# Patient Record
Sex: Male | Born: 1935 | Race: White | Hispanic: No | Marital: Married | Smoking: Never smoker
Health system: Southern US, Community
[De-identification: ages and names within clinical notes are randomized; demographics above are authoritative.]

## PROBLEM LIST (undated history)

## (undated) DIAGNOSIS — I10 Essential (primary) hypertension: Secondary | ICD-10-CM

## (undated) DIAGNOSIS — U071 COVID-19: Secondary | ICD-10-CM

## (undated) DIAGNOSIS — F0390 Unspecified dementia without behavioral disturbance: Secondary | ICD-10-CM

## (undated) DIAGNOSIS — I219 Acute myocardial infarction, unspecified: Secondary | ICD-10-CM

## (undated) DIAGNOSIS — K219 Gastro-esophageal reflux disease without esophagitis: Secondary | ICD-10-CM

---

## 2019-04-05 ENCOUNTER — Inpatient Hospital Stay (HOSPITAL_COMMUNITY)
Admission: AD | Admit: 2019-04-05 | Discharge: 2019-04-15 | DRG: 177 | Disposition: A | Discharge status: Skilled Nursing Facility | Payer: Medicare Other | Source: Other Acute Inpatient Hospital | Attending: Internal Medicine | Admitting: Internal Medicine

## 2019-04-05 DIAGNOSIS — J9601 Acute respiratory failure with hypoxia: Secondary | ICD-10-CM | POA: Diagnosis present

## 2019-04-05 DIAGNOSIS — I10 Essential (primary) hypertension: Secondary | ICD-10-CM | POA: Diagnosis present

## 2019-04-05 DIAGNOSIS — H9190 Unspecified hearing loss, unspecified ear: Secondary | ICD-10-CM | POA: Diagnosis present

## 2019-04-05 DIAGNOSIS — R7881 Bacteremia: Secondary | ICD-10-CM | POA: Diagnosis not present

## 2019-04-05 DIAGNOSIS — H409 Unspecified glaucoma: Secondary | ICD-10-CM | POA: Diagnosis present

## 2019-04-05 DIAGNOSIS — I440 Atrioventricular block, first degree: Secondary | ICD-10-CM | POA: Diagnosis present

## 2019-04-05 DIAGNOSIS — Z880 Allergy status to penicillin: Secondary | ICD-10-CM | POA: Diagnosis not present

## 2019-04-05 DIAGNOSIS — E876 Hypokalemia: Secondary | ICD-10-CM | POA: Diagnosis present

## 2019-04-05 DIAGNOSIS — R680 Hypothermia, not associated with low environmental temperature: Secondary | ICD-10-CM | POA: Diagnosis present

## 2019-04-05 DIAGNOSIS — F05 Delirium due to known physiological condition: Secondary | ICD-10-CM | POA: Diagnosis not present

## 2019-04-05 DIAGNOSIS — U071 COVID-19: Principal | ICD-10-CM | POA: Diagnosis present

## 2019-04-05 DIAGNOSIS — R946 Abnormal results of thyroid function studies: Secondary | ICD-10-CM | POA: Diagnosis present

## 2019-04-05 DIAGNOSIS — I4510 Unspecified right bundle-branch block: Secondary | ICD-10-CM | POA: Diagnosis present

## 2019-04-05 DIAGNOSIS — K219 Gastro-esophageal reflux disease without esophagitis: Secondary | ICD-10-CM | POA: Diagnosis present

## 2019-04-05 DIAGNOSIS — I252 Old myocardial infarction: Secondary | ICD-10-CM | POA: Diagnosis not present

## 2019-04-05 DIAGNOSIS — R001 Bradycardia, unspecified: Secondary | ICD-10-CM | POA: Diagnosis present

## 2019-04-05 DIAGNOSIS — J1289 Other viral pneumonia: Secondary | ICD-10-CM | POA: Diagnosis present

## 2019-04-05 DIAGNOSIS — J1282 Pneumonia due to coronavirus disease 2019: Secondary | ICD-10-CM

## 2019-04-05 DIAGNOSIS — D649 Anemia, unspecified: Secondary | ICD-10-CM | POA: Diagnosis present

## 2019-04-05 DIAGNOSIS — G9341 Metabolic encephalopathy: Secondary | ICD-10-CM | POA: Diagnosis not present

## 2019-04-05 DIAGNOSIS — F0390 Unspecified dementia without behavioral disturbance: Secondary | ICD-10-CM | POA: Diagnosis present

## 2019-04-05 HISTORY — DX: Essential (primary) hypertension: I10

## 2019-04-05 HISTORY — DX: Gastro-esophageal reflux disease without esophagitis: K21.9

## 2019-04-05 HISTORY — DX: Unspecified dementia, unspecified severity, without behavioral disturbance, psychotic disturbance, mood disturbance, and anxiety: F03.90

## 2019-04-05 HISTORY — DX: Acute myocardial infarction, unspecified: I21.9

## 2019-04-06 ENCOUNTER — Encounter (HOSPITAL_COMMUNITY): Payer: Self-pay | Admitting: Internal Medicine

## 2019-04-06 ENCOUNTER — Other Ambulatory Visit: Payer: Self-pay

## 2019-04-06 DIAGNOSIS — F0390 Unspecified dementia without behavioral disturbance: Secondary | ICD-10-CM | POA: Diagnosis present

## 2019-04-06 DIAGNOSIS — R001 Bradycardia, unspecified: Secondary | ICD-10-CM | POA: Diagnosis present

## 2019-04-06 DIAGNOSIS — I10 Essential (primary) hypertension: Secondary | ICD-10-CM | POA: Diagnosis present

## 2019-04-06 DIAGNOSIS — U071 COVID-19: Principal | ICD-10-CM

## 2019-04-06 DIAGNOSIS — K219 Gastro-esophageal reflux disease without esophagitis: Secondary | ICD-10-CM | POA: Diagnosis present

## 2019-04-06 DIAGNOSIS — J9601 Acute respiratory failure with hypoxia: Secondary | ICD-10-CM

## 2019-04-06 LAB — CBC WITH DIFFERENTIAL/PLATELET
Abs Immature Granulocytes: 0.01 10*3/uL (ref 0.00–0.07)
Basophils Absolute: 0 10*3/uL (ref 0.0–0.1)
Basophils Relative: 0 %
Eosinophils Absolute: 0 10*3/uL (ref 0.0–0.5)
Eosinophils Relative: 0 %
HCT: 38.9 % — ABNORMAL LOW (ref 39.0–52.0)
Hemoglobin: 12.6 g/dL — ABNORMAL LOW (ref 13.0–17.0)
Immature Granulocytes: 0 %
Lymphocytes Relative: 10 %
Lymphs Abs: 0.4 10*3/uL — ABNORMAL LOW (ref 0.7–4.0)
MCH: 29.2 pg (ref 26.0–34.0)
MCHC: 32.4 g/dL (ref 30.0–36.0)
MCV: 90 fL (ref 80.0–100.0)
Monocytes Absolute: 0.2 10*3/uL (ref 0.1–1.0)
Monocytes Relative: 4 %
Neutro Abs: 3.8 10*3/uL (ref 1.7–7.7)
Neutrophils Relative %: 86 %
Platelets: 177 10*3/uL (ref 150–400)
RBC: 4.32 MIL/uL (ref 4.22–5.81)
RDW: 13.5 % (ref 11.5–15.5)
WBC: 4.5 10*3/uL (ref 4.0–10.5)
nRBC: 0 % (ref 0.0–0.2)

## 2019-04-06 LAB — FERRITIN: Ferritin: 480 ng/mL — ABNORMAL HIGH (ref 24–336)

## 2019-04-06 LAB — COMPREHENSIVE METABOLIC PANEL
ALT: 12 U/L (ref 0–44)
AST: 26 U/L (ref 15–41)
Albumin: 2.6 g/dL — ABNORMAL LOW (ref 3.5–5.0)
Alkaline Phosphatase: 37 U/L — ABNORMAL LOW (ref 38–126)
Anion gap: 11 (ref 5–15)
BUN: 42 mg/dL — ABNORMAL HIGH (ref 8–23)
CO2: 26 mmol/L (ref 22–32)
Calcium: 9 mg/dL (ref 8.9–10.3)
Chloride: 100 mmol/L (ref 98–111)
Creatinine, Ser: 1.37 mg/dL — ABNORMAL HIGH (ref 0.61–1.24)
GFR calc Af Amer: 53 mL/min — ABNORMAL LOW (ref >60)
GFR calc non Af Amer: 46 mL/min — ABNORMAL LOW (ref >60)
Glucose, Bld: 133 mg/dL — ABNORMAL HIGH (ref 70–99)
Potassium: 3.4 mmol/L — ABNORMAL LOW (ref 3.5–5.1)
Sodium: 137 mmol/L (ref 135–145)
Total Bilirubin: 0.6 mg/dL (ref 0.3–1.2)
Total Protein: 6.1 g/dL — ABNORMAL LOW (ref 6.5–8.1)

## 2019-04-06 LAB — GLUCOSE, CAPILLARY
Glucose-Capillary: 115 mg/dL — ABNORMAL HIGH (ref 70–99)
Glucose-Capillary: 138 mg/dL — ABNORMAL HIGH (ref 70–99)

## 2019-04-06 LAB — ABO/RH: ABO/RH(D): O POS

## 2019-04-06 LAB — BRAIN NATRIURETIC PEPTIDE: B Natriuretic Peptide: 307.7 pg/mL — ABNORMAL HIGH (ref 0.0–100.0)

## 2019-04-06 LAB — LACTATE DEHYDROGENASE: LDH: 280 U/L — ABNORMAL HIGH (ref 98–192)

## 2019-04-06 LAB — PROCALCITONIN: Procalcitonin: 0.16 ng/mL

## 2019-04-06 LAB — TSH: TSH: 0.257 u[IU]/mL — ABNORMAL LOW (ref 0.350–4.500)

## 2019-04-06 LAB — FIBRINOGEN: Fibrinogen: 686 mg/dL — ABNORMAL HIGH (ref 210–475)

## 2019-04-06 LAB — D-DIMER, QUANTITATIVE: D-Dimer, Quant: 1.67 ug/mL-FEU — ABNORMAL HIGH (ref 0.00–0.50)

## 2019-04-06 LAB — C-REACTIVE PROTEIN: CRP: 18.8 mg/dL — ABNORMAL HIGH (ref <1.0)

## 2019-04-06 MED ORDER — DEXAMETHASONE 6 MG PO TABS
6.0000 mg | ORAL_TABLET | ORAL | Status: DC
  Administered 2019-04-06: 10:00:00 6 mg via ORAL
  Filled 2019-04-06: qty 1

## 2019-04-06 MED ORDER — ENOXAPARIN SODIUM 40 MG/0.4ML ~~LOC~~ SOLN
40.0000 mg | SUBCUTANEOUS | Status: DC
  Administered 2019-04-06 – 2019-04-09 (×4): 40 mg via SUBCUTANEOUS
  Filled 2019-04-06 (×4): qty 0.4

## 2019-04-06 MED ORDER — HYDROCOD POLST-CPM POLST ER 10-8 MG/5ML PO SUER
5.0000 mL | Freq: Two times a day (BID) | ORAL | Status: DC | PRN
  Administered 2019-04-09 – 2019-04-14 (×4): 5 mL via ORAL
  Filled 2019-04-06 (×4): qty 5

## 2019-04-06 MED ORDER — ACETAMINOPHEN 325 MG PO TABS: 650.0000 mg | ORAL_TABLET | Freq: Four times a day (QID) | ORAL | Status: DC | PRN

## 2019-04-06 MED ORDER — DEXAMETHASONE SODIUM PHOSPHATE 10 MG/ML IJ SOLN
6.0000 mg | Freq: Two times a day (BID) | INTRAMUSCULAR | Status: DC
  Administered 2019-04-06 – 2019-04-12 (×13): 6 mg via INTRAVENOUS
  Filled 2019-04-06 (×6): qty 1
  Filled 2019-04-06: qty 0.6
  Filled 2019-04-06 (×6): qty 1

## 2019-04-06 MED ORDER — FAMOTIDINE 20 MG PO TABS
20.0000 mg | ORAL_TABLET | Freq: Every day | ORAL | Status: DC
  Administered 2019-04-06 – 2019-04-15 (×10): 20 mg via ORAL
  Filled 2019-04-06 (×10): qty 1

## 2019-04-06 MED ORDER — GUAIFENESIN-DM 100-10 MG/5ML PO SYRP
10.0000 mL | ORAL_SOLUTION | ORAL | Status: DC | PRN
  Administered 2019-04-06 – 2019-04-09 (×3): 10 mL via ORAL
  Filled 2019-04-06 (×3): qty 10

## 2019-04-06 MED ORDER — VITAMIN C 500 MG PO TABS
500.0000 mg | ORAL_TABLET | Freq: Every day | ORAL | Status: DC
  Administered 2019-04-06 – 2019-04-15 (×10): 500 mg via ORAL
  Filled 2019-04-06 (×9): qty 1

## 2019-04-06 MED ORDER — TOCILIZUMAB 400 MG/20ML IV SOLN
8.0000 mg/kg | Freq: Once | INTRAVENOUS | Status: AC
  Administered 2019-04-06: 544 mg via INTRAVENOUS
  Filled 2019-04-06: qty 10

## 2019-04-06 MED ORDER — ENSURE ENLIVE PO LIQD
237.0000 mL | Freq: Three times a day (TID) | ORAL | Status: DC
  Administered 2019-04-06 – 2019-04-15 (×23): 237 mL via ORAL

## 2019-04-06 MED ORDER — IPRATROPIUM-ALBUTEROL 20-100 MCG/ACT IN AERS
1.0000 | INHALATION_SPRAY | Freq: Four times a day (QID) | RESPIRATORY_TRACT | Status: DC
  Administered 2019-04-06 – 2019-04-15 (×34): 1 via RESPIRATORY_TRACT
  Filled 2019-04-06: qty 4

## 2019-04-06 MED ORDER — ZINC SULFATE 220 (50 ZN) MG PO CAPS
220.0000 mg | ORAL_CAPSULE | Freq: Every day | ORAL | Status: DC
  Administered 2019-04-06 – 2019-04-15 (×10): 220 mg via ORAL
  Filled 2019-04-06 (×9): qty 1

## 2019-04-06 MED ORDER — SODIUM CHLORIDE 0.9 % IV SOLN
100.0000 mg | Freq: Every day | INTRAVENOUS | Status: AC
  Administered 2019-04-06 – 2019-04-09 (×4): 100 mg via INTRAVENOUS
  Filled 2019-04-06 (×4): qty 20

## 2019-04-06 MED ORDER — DOCUSATE SODIUM 100 MG PO CAPS
100.0000 mg | ORAL_CAPSULE | Freq: Every day | ORAL | Status: DC
  Administered 2019-04-06 – 2019-04-15 (×10): 100 mg via ORAL
  Filled 2019-04-06 (×10): qty 1

## 2019-04-06 MED ORDER — POTASSIUM CHLORIDE CRYS ER 20 MEQ PO TBCR
40.0000 meq | EXTENDED_RELEASE_TABLET | Freq: Once | ORAL | Status: AC
  Administered 2019-04-06: 40 meq via ORAL
  Filled 2019-04-06: qty 2

## 2019-04-06 MED ORDER — SODIUM CHLORIDE 0.9% IV SOLUTION
Freq: Once | INTRAVENOUS | Status: AC
  Administered 2019-04-06: 20:00:00 via INTRAVENOUS

## 2019-04-06 NOTE — Progress Notes (Signed)
PT arrived from [place] on stretcher. No distress noted. Notified md concerning HR of 38 bpm. Awaiting response.

## 2019-04-06 NOTE — Progress Notes (Signed)
Pt transferred from [facility] briefly with pharmacist at [facility]  Pt received: Remdesivir 200mg  12/11 1600 Azithromycin 500mg  IV 12/11 1717 Rocephin 1gm IV 12/11 1548 Dexamethasone 6mg  IV 12/11 1257 Lovenox 40mg  SQ 12/11 1552  Labs 12/11: ALT 13 SCr 1.5  Plan: Remdesivir 100mg  daily x 4 more doses (total of 5 day course)   Sherlon Handing, PharmD, BCPS Please see amion for complete clinical pharmacist phone list 04/06/2019 1:20 AM

## 2019-04-06 NOTE — Progress Notes (Addendum)
PROGRESS NOTE  Cody Bradshaw EOF:[id number] DOB: 05-26-1929 DOA: 04/05/2019  PCP: System, Provider Not In  Brief History/Interval Summary:  85 y.o. male, dementia, HOH who presented as a transfer from [facility] with respiratory failure with hypoxia secondary to COVID-19 infection.  Patient had been complaining of upper respiratory symptoms for several days, he tested positive for COVID-19 2 days prior to admission.  Was evaluated in the emergency room at that time, he was not requiring oxygen and his symptoms were relatively mild so he was discharged home.  EMS was called to the patient's residence due to altered mental status, upon arrival patient was febrile with a temperature of 104.5 F, hypoxic with oxygen saturation of 64% on room air.  Patient was placed on oxygen, given Tylenol and transferred to [facility] ER.  Chest x-ray showed bibasilar infiltrates.  Patient was transferred here for further management.  Reason for Visit: Acute respiratory failure with hypoxia.  Pneumonia due to COVID-19.  Consultants: None  Procedures: None  Antibiotics: Anti-infectives (From admission, onward)   Start     Dose/Rate Route Frequency Ordered Stop   04/06/19 1000  remdesivir 100 mg in sodium chloride 0.9 % 100 mL IVPB     100 mg 200 mL/hr over 30 Minutes Intravenous Daily 04/06/19 0121 04/10/19 0959      Subjective/Interval History: Patient is very hard of hearing.  It was very difficult to have a conversation with him.  He was also noted to be distracted.  He denies any chest pain.  States that he is feeling a little bit short of breath.  No other complaints offered.    Assessment/Plan:  Acute Hypoxic Resp. Failure/Pneumonia due to COVID-19  COVID-19 Labs   Recent Labs  Lab 04/06/19 0420  DDIMER 1.67*  FERRITIN 480*  CRP 18.8*  ALT 12  PROCALCITON 0.16   Positive COVID-19 test result at [facility].  Objective findings: Fever: Noted to be somewhat hypothermic  here. Oxygen requirements: High flow nasal cannula.  5 L/min.  Saturating in the early 90s.  COVID 19 Therapeutics: Antibacterials: Patient was given ceftriaxone and azithromycin at [facility].  Not continued. Remdesivir: Day 2 today.  First dose was at [facility]. Steroids: Dexamethasone 6 mg daily. Diuretics: Not on diuretics on a scheduled basis here Actemra: 1 dose ordered today Convalescent Plasma: Family is going over consent forms currently Vitamin C and Zinc: Continue PUD Prophylaxis: Will initiate Pepcid DVT Prophylaxis:  Lovenox 40 mg daily  Patient remains tenuous from a respiratory standpoint.  He is at high risk for decompensation.  His CRP is noted to be elevated at 18.8.  D-dimer 1.67.  Ferritin 480.  Procalcitonin 0.16 not considered significantly elevated.  Hold off on antibacterials.  Please see below regarding experimental treatment.  Family agreed to Actemra.  They are going over the consent form for the convalescent plasma currently.  Incentive spirometry, mobilization, out of bed to chair as tolerated.  Prone positioning as tolerated.  After reviewing the consent forms for the convalescent plasma patient's family agrees for him to get it.  They have signed the form and have him email it back to me.  I have signed the form.  It will be placed in the shadow chart.  Convalescent plasma has been ordered.  The treatment plan and use of medications and known side effects were discussed with patient's family. Some of the medications used are based on case reports/anecdotal data.  Steroids have shown some benefit in treatment of  COVID-19 based on at least one study.  Another medication used is Actemra (Tocilizumab). However randomized control trials involving this drug have not shown any benefit although the final reports have not been published yet.  Complete risks and long-term side effects are unknown, however in the best clinical judgment they seem to be of some benefit.   Despite lack of benefit noted on preliminary reports from RCT's patient's family does want this medication knowing that this is considered experimental treatment.  Actemra was ordered.  Hypothermia Patient apparently was febrile when evaluated by EMS.  Noted to be hypothermic here.  This is all most likely due to the viral illness.  Currently on warming blankets.  Continue to monitor closely.  Bradycardia Unclear if the patient is on any AV blocking agents.  Home medication list is incomplete.  Continue to monitor on telemetry for now.  Could be due to the hypothermia.  Check EKG.  Essential hypertension Monitor blood pressures closely.  History of GERD Pepcid  History of dementia  Currently stable.  He is very hard of hearing which makes it communicating with him very challenging.  Abnormal thyroid function test Low TSH noted.  Check free T4 in the morning.  Hypokalemia Will be repleted.  Normocytic anemia No evidence of overt bleeding.  Continue to monitor closely.   DVT Prophylaxis: Lovenox Code Status: Full code Family Communication: Discussed with the patient's wife and daughter Disposition Plan: Management as outlined above.   Medications:  Scheduled: . dexamethasone  6 mg Oral Q24H  . docusate sodium  100 mg Oral Daily  . enoxaparin (LOVENOX) injection  40 mg Subcutaneous Q24H  . Ipratropium-Albuterol  1 puff Inhalation Q6H  . vitamin C  500 mg Oral Daily  . zinc sulfate  220 mg Oral Daily   Continuous: . remdesivir 100 mg in NS 100 mL 100 mg (04/06/19 1018)  . tocilizumab (ACTEMRA) IV     ONG:EXBMWUXLKGMWN, chlorpheniramine-HYDROcodone, guaiFENesin-dextromethorphan   Objective:  Vital Signs  Vitals:   04/06/19 0100 04/06/19 0400 04/06/19 0600 04/06/19 0729  BP:   110/71 119/71  Pulse:   (!) 50   Resp:   (!) 25 19  Temp: (!) 94 F (34.4 C) (!) 94.9 F (34.9 C) (!) 96.4 F (35.8 C) (!) 97.4 F (36.3 C)  TempSrc: Rectal Rectal Rectal Oral  SpO2:   91% (!) 89%   Weight: 68 kg     Height: 5\' 8"  (1.727 m)       Intake/Output Summary (Last 24 hours) at 04/06/2019 1147 Last data filed at 04/06/2019 0900 Gross per 24 hour  Intake 120 ml  Output 275 ml  Net -155 ml   Filed Weights   04/06/19 0100  Weight: 68 kg    General appearance: Awake alert.  In no distress.  Distracted.  Very hard of hearing. Resp: Tachypneic at rest.  Coarse breath sounds bilaterally.  Crackles at the bases.  No wheezing or rhonchi. Cardio: S1-S2 is bradycardic regular.  No S3-S4.  No rubs or bruit. GI: Abdomen is soft.  Nontender nondistended.  Bowel sounds are present normal.  No masses organomegaly Extremities: No edema.  Full range of motion of lower extremities. Neurologic: No obvious focal neurological deficits.   Lab Results:  Data Reviewed: I have personally reviewed following labs and imaging studies  CBC: Recent Labs  Lab 04/06/19 0420  WBC 4.5  NEUTROABS 3.8  HGB 12.6*  HCT 38.9*  MCV 90.0  PLT 027    Basic Metabolic  Panel: Recent Labs  Lab 04/06/19 0420  NA 137  K 3.4*  CL 100  CO2 26  GLUCOSE 133*  BUN 42*  CREATININE 1.37*  CALCIUM 9.0    GFR: Estimated Creatinine Clearance: 36.5 mL/min (A) (by C-G formula based on SCr of 1.37 mg/dL (H)).  Liver Function Tests: Recent Labs  Lab 04/06/19 0420  AST 26  ALT 12  ALKPHOS 37*  BILITOT 0.6  PROT 6.1*  ALBUMIN 2.6*    CBG: Recent Labs  Lab 04/06/19 0033 04/06/19 0854  GLUCAP 138* 115*     Thyroid Function Tests: Recent Labs    04/06/19 0420  TSH 0.257*    Anemia Panel: Recent Labs    04/06/19 0420  FERRITIN 480*    No results found for this or any previous visit (from the past 240 hour(s)).    Radiology Studies: No results found.     LOS: 1 day   Besan Ketchem Foot LockerKrishnan  [facility] Pager on [email or web address]  04/06/2019, 11:47 AM

## 2019-04-06 NOTE — Plan of Care (Signed)
Pt will have patent airway during stay

## 2019-04-06 NOTE — H&P (Signed)
TRH H&P   Patient Demographics:    Cody Bradshaw, is a 85 y.o. male  MRN: [id number]   DOB - Jul 07, 1929  Admit Date - 04/05/2019  Outpatient Primary MD for the patient is No primary care provider on file.  Patient coming from: [facility]  No chief complaint on file.     HPI:    Cody Bradshaw  is a 85 y.o. male, dementia, HOH who presents as a transfer from [facility] with respiratory failure with hypoxia secondary to COVID-19 infection.  Patient had been complaining of upper respiratory symptoms for several days, he tested positive for COVID-19 2 days ago.  Was evaluated in the emergency room at that time, he was not requiring oxygen and his symptoms were relatively mild so he was discharged home.  EMS was called to the patient's residence due to altered mental status, upon arrival patient was febrile with a temperature of 104.5 F, hypoxic with oxygen saturation of 64% on room air.  Patient was placed on oxygen, given Tylenol and transferred to [facility] ER. In the ER lab work revealed WBC 601, procal 0.14(<0.05), CRP 211.8(<10), lactic acid 1.3, ferritin 710(62-694), proBNP 2010(<1800). CXR mild hazy bibasilar infiltrates.  He was started on steroids, remdesivir and transferred to [facility] for further management.   Review of systems:  Review of Systems:  Constitutional: negative for anorexia, chills, fatigue or fevers HEENT: negative for earaches, epistaxis, or sore throat Respiratory: see HPI Cardiovascular: see HPI GU: negative for dysuria, urinary frequency, urinary urgency, hematuria Gastrointestinal: negative for abdominal pain, constipation, diarrhea, nausea or vomiting Musculoskeletal: negative  for arthralgias, back pain or myalgias Neurological: negative for dizziness, headaches or weakness Behavioral/Psych: negative for suicidal or homicidal ideation Skin:negative for rash Heme: negative for bruises Endo: negative for hair loss, weight gain/loss  With Past History of the following :   Past Medical History:  Diagnosis Date  . Dementia (HCC)   . GERD (gastroesophageal reflux disease)   . Hypertension   . MI (myocardial infarction) (HCC)       History reviewed. No pertinent surgical history.   Social History:     Social History   Tobacco Use  . Smoking status: Never Smoker  . Smokeless tobacco: Never Used  Substance Use Topics  . Alcohol use: Not Currently     Family History :  History reviewed. No pertinent family history.   Home Medications:   Prior to Admission medications   Not on File     Allergies:     Allergies  Allergen Reactions  . Penicillins Shortness Of Breath     Physical Exam:   Vitals  Height 5\' 8"  (1.727 m), weight 68 kg.  Physical Exam   Constitutional - resting comfortably, no acute distress Eyes - pupils equal round and reactive to light and accomodation, extra ocular movements intact Nose - no gross deformity or drainage Mouth - no oral lesions noted Throat - no swelling or erythema Neck - supple, no JVD   CV - (+)S1S2, no murmurs  Resp -lower lung field rales,  GI - (+)BS, soft, non-tender, non-distended Extrem - no clubbing, cyanosis, or peripheral edema  Skin - no rashes or wounds Neuro - alert, aware, oriented to person/place/time  Psych - normal affect, no anxiety   Patient has Pressure Ulcer on Admission?: no   Data Review:    CBC No results for input(s): WBC, HGB, HCT, PLT, MCV, MCH, MCHC, RDW, LYMPHSABS, MONOABS, EOSABS, BASOSABS, BANDABS in the last 168 hours.  Invalid input(s): NEUTRABS,  BANDSABD ------------------------------------------------------------------------------------------------------------------  Chemistries  No results for input(s): NA, K, CL, CO2, GLUCOSE, BUN, CREATININE, CALCIUM, MG, AST, ALT, ALKPHOS, BILITOT in the last 168 hours.  Invalid input(s): GFRCGP ------------------------------------------------------------------------------------------------------------------ CrCl cannot be calculated (No successful lab value found.). ------------------------------------------------------------------------------------------------------------------ No results for input(s): TSH, T4TOTAL, T3FREE, THYROIDAB in the last 72 hours.  Invalid input(s): FREET3  Coagulation profile No results for input(s): INR, PROTIME in the last 168 hours. ------------------------------------------------------------------------------------------------------------------- No results for input(s): DDIMER in the last 72 hours. -------------------------------------------------------------------------------------------------------------------  Cardiac Enzymes No results for input(s): CKMB, TROPONINI, MYOGLOBIN in the last 168 hours.  Invalid input(s): CK ------------------------------------------------------------------------------------------------------------------ No results found for: BNP   ---------------------------------------------------------------------------------------------------------------  Urinalysis No results found for: COLORURINE, APPEARANCEUR, LABSPEC, PHURINE, GLUCOSEU, HGBUR, BILIRUBINUR, KETONESUR, PROTEINUR, UROBILINOGEN, NITRITE, LEUKOCYTESUR  ----------------------------------------------------------------------------------------------------------------   Imaging Results:    No results found.   Assessment & Plan:    Principal Problem:   Acute hypoxemic respiratory failure due to severe acute respiratory syndrome coronavirus 2 (SARS-CoV-2) disease  (HCC) Active Problems:   Hypertension   GERD (gastroesophageal reflux disease)   Dementia (HCC)   Bradycardia      Acute hypoxemic respiratory failure due to SARS-CoV-2 disease: Patient was evaluated in the emergency room 2 days ago with mild upper respiratory symptoms, diagnosed with SARS-CoV-2, discharged home on room air presents with hypoxia with sats of 64% on room air. Date of Dx: 04/03/2019 Oxygen requirements: 4 LPM Antibiotics: None  Diuretics: None  Vitamin C and Zinc: Per protocol Remdesivir: Started on 04/05/2019 Steroids: Started on 04/05/2019 Actemra: Not given yet Convalescent Plasma: Not given yet DVT Prophylaxis:   Lovenox    Bradycardia/hypothermia: Patient was asymptomatic from the bradycardia or hypothermia.  Baseline heart rate is unknown.  Sepsis is considered, initial pro-calcitonin at OSH was mildly elevated.  We will repeat, low threshold for broad-spectrum antibiotics if he were to become hypotensive.  On warming blanket.  Follow-up TSH.    Hypertension: Blood pressure is currently at goal.    GERD: Patient carries diagnosis.  Resume his home meds when verified by pharmacy.    Dementia: Patient has some dementia.  Resume his home meds when verified by pharmacy.  DVT Prophylaxis Lovenox  AM Labs Ordered, also please review Full Orders  Family Communication: Admission, patients condition and plan of care including tests being ordered  have been discussed with the patient who indicate understanding and agree with the plan and Code Status.  Code Status full code  Likely DC to home  Condition GUARDED   Consults called: None  Admission status: Admit to inpatient  Time spent in minutes : 55   Peyton Bottoms M.D on 04/06/2019 at 1:20 AM  To page go to [email or web address] - password [facility]

## 2019-04-07 ENCOUNTER — Inpatient Hospital Stay (HOSPITAL_COMMUNITY): Payer: Medicare Other

## 2019-04-07 ENCOUNTER — Other Ambulatory Visit: Payer: Self-pay

## 2019-04-07 DIAGNOSIS — F0390 Unspecified dementia without behavioral disturbance: Secondary | ICD-10-CM

## 2019-04-07 DIAGNOSIS — R7881 Bacteremia: Secondary | ICD-10-CM

## 2019-04-07 DIAGNOSIS — R001 Bradycardia, unspecified: Secondary | ICD-10-CM

## 2019-04-07 DIAGNOSIS — J1289 Other viral pneumonia: Secondary | ICD-10-CM

## 2019-04-07 LAB — CBC WITH DIFFERENTIAL/PLATELET
Abs Immature Granulocytes: 0.03 10*3/uL (ref 0.00–0.07)
Basophils Absolute: 0 10*3/uL (ref 0.0–0.1)
Basophils Relative: 0 %
Eosinophils Absolute: 0 10*3/uL (ref 0.0–0.5)
Eosinophils Relative: 0 %
HCT: 41 % (ref 39.0–52.0)
Hemoglobin: 13.2 g/dL (ref 13.0–17.0)
Immature Granulocytes: 1 %
Lymphocytes Relative: 6 %
Lymphs Abs: 0.3 10*3/uL — ABNORMAL LOW (ref 0.7–4.0)
MCH: 29.2 pg (ref 26.0–34.0)
MCHC: 32.2 g/dL (ref 30.0–36.0)
MCV: 90.7 fL (ref 80.0–100.0)
Monocytes Absolute: 0.2 10*3/uL (ref 0.1–1.0)
Monocytes Relative: 3 %
Neutro Abs: 4.5 10*3/uL (ref 1.7–7.7)
Neutrophils Relative %: 90 %
Platelets: 206 10*3/uL (ref 150–400)
RBC: 4.52 MIL/uL (ref 4.22–5.81)
RDW: 13.7 % (ref 11.5–15.5)
WBC: 5 10*3/uL (ref 4.0–10.5)
nRBC: 0 % (ref 0.0–0.2)

## 2019-04-07 LAB — COMPREHENSIVE METABOLIC PANEL
ALT: 14 U/L (ref 0–44)
AST: 29 U/L (ref 15–41)
Albumin: 3 g/dL — ABNORMAL LOW (ref 3.5–5.0)
Alkaline Phosphatase: 44 U/L (ref 38–126)
Anion gap: 13 (ref 5–15)
BUN: 50 mg/dL — ABNORMAL HIGH (ref 8–23)
CO2: 26 mmol/L (ref 22–32)
Calcium: 9.5 mg/dL (ref 8.9–10.3)
Chloride: 105 mmol/L (ref 98–111)
Creatinine, Ser: 1.32 mg/dL — ABNORMAL HIGH (ref 0.61–1.24)
GFR calc Af Amer: 56 mL/min — ABNORMAL LOW (ref >60)
GFR calc non Af Amer: 48 mL/min — ABNORMAL LOW (ref >60)
Glucose, Bld: 165 mg/dL — ABNORMAL HIGH (ref 70–99)
Potassium: 4 mmol/L (ref 3.5–5.1)
Sodium: 144 mmol/L (ref 135–145)
Total Bilirubin: 0.7 mg/dL (ref 0.3–1.2)
Total Protein: 6.5 g/dL (ref 6.5–8.1)

## 2019-04-07 LAB — D-DIMER, QUANTITATIVE: D-Dimer, Quant: 1.69 ug/mL-FEU — ABNORMAL HIGH (ref 0.00–0.50)

## 2019-04-07 LAB — MAGNESIUM: Magnesium: 2.4 mg/dL (ref 1.7–2.4)

## 2019-04-07 LAB — C-REACTIVE PROTEIN: CRP: 13.3 mg/dL — ABNORMAL HIGH (ref <1.0)

## 2019-04-07 LAB — FERRITIN: Ferritin: 683 ng/mL — ABNORMAL HIGH (ref 24–336)

## 2019-04-07 LAB — T4, FREE: Free T4: 1.87 ng/dL — ABNORMAL HIGH (ref 0.61–1.12)

## 2019-04-07 MED ORDER — QUETIAPINE FUMARATE 25 MG PO TABS
25.0000 mg | ORAL_TABLET | Freq: Every day | ORAL | Status: DC
  Administered 2019-04-07 – 2019-04-08 (×2): 25 mg via ORAL
  Filled 2019-04-07 (×2): qty 1

## 2019-04-07 MED ORDER — BRINZOLAMIDE-BRIMONIDINE 1-0.2 % OP SUSP: 1.0000 [drp] | Freq: Three times a day (TID) | OPHTHALMIC | Status: DC

## 2019-04-07 MED ORDER — POLYVINYL ALCOHOL 1.4 % OP SOLN
1.0000 [drp] | Freq: Two times a day (BID) | OPHTHALMIC | Status: DC | PRN
  Filled 2019-04-07: qty 15

## 2019-04-07 MED ORDER — BRINZOLAMIDE 1 % OP SUSP
1.0000 [drp] | Freq: Three times a day (TID) | OPHTHALMIC | Status: DC
  Administered 2019-04-07 – 2019-04-15 (×23): 1 [drp] via OPHTHALMIC
  Filled 2019-04-07: qty 10

## 2019-04-07 MED ORDER — BRIMONIDINE TARTRATE 0.2 % OP SOLN
1.0000 [drp] | Freq: Three times a day (TID) | OPHTHALMIC | Status: DC
  Administered 2019-04-07 – 2019-04-15 (×23): 1 [drp] via OPHTHALMIC
  Filled 2019-04-07: qty 5

## 2019-04-07 MED ORDER — LATANOPROST 0.005 % OP SOLN
1.0000 [drp] | Freq: Every day | OPHTHALMIC | Status: DC
  Administered 2019-04-07 – 2019-04-14 (×8): 1 [drp] via OPHTHALMIC
  Filled 2019-04-07: qty 2.5

## 2019-04-07 MED ORDER — MEMANTINE HCL 10 MG PO TABS
10.0000 mg | ORAL_TABLET | Freq: Two times a day (BID) | ORAL | Status: DC
  Administered 2019-04-07 – 2019-04-15 (×17): 10 mg via ORAL
  Filled 2019-04-07 (×18): qty 1

## 2019-04-07 MED ORDER — HALOPERIDOL LACTATE 5 MG/ML IJ SOLN
2.0000 mg | Freq: Four times a day (QID) | INTRAMUSCULAR | Status: DC | PRN
  Administered 2019-04-07 – 2019-04-09 (×4): 2 mg via INTRAVENOUS
  Filled 2019-04-07 (×5): qty 1

## 2019-04-07 NOTE — Progress Notes (Signed)
Convalescent plasma delivered to floor.  Patient not oriented and did not have consent signed.  Plasma placed back in cooler, good till 5am in cooler per lab.  Per notes Dr. Maryland Pink spoke with family yesterday and informed them of patient's need for convalescent plasma.  Call placed to [facility] who confirms family consents to the patient receiving convalescent plasma, witnessed by 2 nurses. During this time the patient frequently attempted to get out of bed, pulled his IV out and took his oxygen off.  With oxygen off the patient desaturates to the 70's.  Dr. Vanita Ingles updated, sitter ordered.

## 2019-04-07 NOTE — Progress Notes (Signed)
PROGRESS NOTE  Thornton DalesJoseph Hosek RUE:[id number] DOB: 1929-11-01 DOA: 04/05/2019  PCP: System, Provider Not In  Brief History/Interval Summary:  85 y.o. male, dementia, HOH who presented as a transfer from [facility] with respiratory failure with hypoxia secondary to COVID-19 infection.  Patient had been complaining of upper respiratory symptoms for several days, he tested positive for COVID-19 2 days prior to admission.  Was evaluated in the emergency room at that time, he was not requiring oxygen and his symptoms were relatively mild so he was discharged home.  EMS was called to the patient's residence due to altered mental status, upon arrival patient was febrile with a temperature of 104.5 F, hypoxic with oxygen saturation of 64% on room air.  Patient was placed on oxygen, given Tylenol and [facility] to [facility].  Chest x-ray showed bibasilar infiltrates.  Patient was transferred here for further management.  Reason for Visit: Acute respiratory failure with hypoxia.  Pneumonia due to COVID-19.  Consultants: None  Procedures: None  Antibiotics: Anti-infectives (From admission, onward)   Start     Dose/Rate Route Frequency Ordered Stop   04/06/19 1000  remdesivir 100 mg in sodium chloride 0.9 % 100 mL IVPB     100 mg 200 mL/hr over 30 Minutes Intravenous Daily 04/06/19 0121 04/10/19 0959      Subjective/Interval History: Patient is extremely hard of hearing.  Very difficult to obtain answers from him.  Apparently some agitation overnight was noted.  Patient denies any complaints this morning.  Denies any difficulty breathing. There is a Comptrollersitter at bedside.     Assessment/Plan:  Acute Hypoxic Resp. Failure/Pneumonia due to COVID-19  COVID-19 Labs   Recent Labs  Lab 04/06/19 0420 04/07/19 0158  DDIMER 1.67* 1.69*  FERRITIN 480* 683*  CRP 18.8* 13.3*  ALT 12 14  PROCALCITON 0.16  --    Positive COVID-19 test result at [facility].  Objective  findings: Fever: Temperatures appear to have improved.  Still on warming blanket. Oxygen requirements: High flow nasal cannula.  3 L/min.  Saturating in the mid 90s.    COVID 19 Therapeutics: Antibacterials: Patient was given ceftriaxone and azithromycin at [facility].  Not continued. Remdesivir: Day 3 today.  [facility]. Steroids: Dexamethasone 6 mg every 12 hours. Diuretics: Not on diuretics on a scheduled basis here Actemra: 1 dose given on 12/12 Convalescent Plasma: Administered on 12/13 Vitamin C and Zinc: Continue PUD Prophylaxis: Pepcid DVT Prophylaxis:  Lovenox 40 mg daily  Patient seems to be slightly better from a respiratory standpoint.  His oxygen requirements have gone down to 3 L from 5 L.  He has received Actemra and convalescent plasma.  He remains on remdesivir and steroids.  CRP improved to 13.3.  Ferritin 683.  D-dimer 1.69.  Continue current management.  Incentive spirometry, mobilization out of bed to chair.  Prone positioning as tolerated.  Chest x-ray done this morning shows stable bibasilar opacities.  Acute metabolic encephalopathy Noted to be confused overnight.  Was frequently trying to get out of the bed.  Currently has a Comptrollersitter.  Seems to be calm this morning.  Could have some sundowning.  Continue to monitor.  No focal neurological deficits noted.  Bacteremia [facility]ax [facility] reviewed that patient had gram-positive cocci.  Unclear how many sets of blood cultures were positive.  Was hypothermic here initially.  However his WBC was normal.  Procalcitonin was only 0.16.  That could have been a contaminant.  We will order blood cultures again.  Repeat procalcitonin levels.  Hold off on adding antibiotics at this time.  Hypothermia Patient apparently was febrile when evaluated by EMS.  Subsequently noted to be hypothermic.  Possibly due to viral illness.  Warming blanket.  Improved.    Bradycardia Patient not on any oral  AV blocking agents.  He is noted to be on timolol eyedrops.  That will be held.  EKG shows first-degree AV block.  RBBB.  Nonspecific ST and T wave changes.  Continue to monitor for now.  TSH was low.  See below.    Essential hypertension Blood pressure is reasonably well controlled.  History of GERD Pepcid  History of dementia  Currently stable.  He is very hard of hearing which makes it communicating with him very challenging.  Continue memantine.  History of glaucoma Holding timolol due to bradycardia.  Continue other eyedrops.  Abnormal thyroid function test Low TSH noted.  Free T4 is pending.  Hypokalemia Potassium is normal today.  Magnesium is 2.4.  Normocytic anemia No evidence of overt bleeding.  IMA globin is stable.   DVT Prophylaxis: Lovenox Code Status: Full code Family Communication: We will update his wife today. Disposition Plan: PT and OT evaluation.   Medications:  Scheduled: . brinzolamide  1 drop Both Eyes TID   And  . brimonidine  1 drop Both Eyes TID  . dexamethasone (DECADRON) injection  6 mg Intravenous Q12H  . docusate sodium  100 mg Oral Daily  . enoxaparin (LOVENOX) injection  40 mg Subcutaneous Q24H  . famotidine  20 mg Oral Daily  . feeding supplement (ENSURE ENLIVE)  237 mL Oral TID BM  . Ipratropium-Albuterol  1 puff Inhalation Q6H  . latanoprost  1 drop Both Eyes QHS  . memantine  10 mg Oral BID  . QUEtiapine  25 mg Oral QHS  . vitamin C  500 mg Oral Daily  . zinc sulfate  220 mg Oral Daily   Continuous: . remdesivir 100 mg in NS 100 mL 100 mg (04/07/19 0932)   TGG:YIRSWNIOEVOJJ, chlorpheniramine-HYDROcodone, guaiFENesin-dextromethorphan, haloperidol lactate, polyvinyl alcohol   Objective:  Vital Signs  Vitals:   04/07/19 0430 04/07/19 0500 04/07/19 0600 04/07/19 0730  BP: (!) 140/101 135/71 (!) 131/97 137/74  Pulse: (!) 40 (!) 48 (!) 57 (!) 58  Resp: (!) 25 (!) 22 (!) 22 19  Temp: (!) 97.4 F (36.3 C) (!) 97.4 F (36.3  C)  97.7 F (36.5 C)  TempSrc: Oral Oral  Oral  SpO2: 94% 94% 92% 97%  Weight:      Height:        Intake/Output Summary (Last 24 hours) at 04/07/2019 0954 Last data filed at 04/07/2019 0730 Gross per 24 hour  Intake 845 ml  Output 650 ml  Net 195 ml   Filed Weights   04/06/19 0100  Weight: 68 kg    General appearance: Awake alert.  In no distress.  Very hard of hearing Resp: Coarse breath sounds bilaterally.  Few crackles at the bases.  No wheezing or rhonchi. Cardio: S1-S2 is normal regular.  No S3-S4.  No rubs murmurs or bruit GI: Abdomen is soft.  Nontender nondistended.  Bowel sounds are present normal.  No masses organomegaly Extremities: No edema.  Moving all his extremities. Neurologic: Noted to be alert.  No obvious focal neurological deficits.    Lab Results:  Data Reviewed: I have personally reviewed following labs and imaging studies  CBC: Recent Labs  Lab  04/06/19 0420 04/07/19 0158  WBC 4.5 5.0  NEUTROABS 3.8 4.5  HGB 12.6* 13.2  HCT 38.9* 41.0  MCV 90.0 90.7  PLT 177 206    Basic Metabolic Panel: Recent Labs  Lab 04/06/19 0420 04/07/19 0158  NA 137 144  K 3.4* 4.0  CL 100 105  CO2 26 26  GLUCOSE 133* 165*  BUN 42* 50*  CREATININE 1.37* 1.32*  CALCIUM 9.0 9.5  MG  --  2.4    GFR: Estimated Creatinine Clearance: 37.9 mL/min (A) (by C-G formula based on SCr of 1.32 mg/dL (H)).  Liver Function Tests: Recent Labs  Lab 04/06/19 0420 04/07/19 0158  AST 26 29  ALT 12 14  ALKPHOS 37* 44  BILITOT 0.6 0.7  PROT 6.1* 6.5  ALBUMIN 2.6* 3.0*    CBG: Recent Labs  Lab 04/06/19 0033 04/06/19 0854  GLUCAP 138* 115*     Thyroid Function Tests: Recent Labs    04/06/19 0420  TSH 0.257*    Anemia Panel: Recent Labs    04/06/19 0420 04/07/19 0158  FERRITIN 480* 683*    No results found for this or any previous visit (from the past 240 hour(s)).    Radiology Studies: DG CHEST PORT 1 VIEW  Result Date:  04/07/2019 CLINICAL DATA:  Pneumonia due to COVID-19 virus. EXAM: PORTABLE CHEST 1 VIEW COMPARISON:  April 05, 2019. FINDINGS: Stable cardiomediastinal silhouette. Stable bibasilar opacities are noted concerning for pneumonia. Atherosclerosis of thoracic aorta is noted. No pneumothorax or pleural effusion is noted. Bony thorax is unremarkable. IMPRESSION: Aortic atherosclerosis. Stable bibasilar opacities are noted concerning for pneumonia. Follow-up radiographs are recommended to ensure resolution. Electronically Signed   By: Lupita Raider M.D.   On: 04/07/2019 08:58       LOS: 2 days   Dnyla Antonetti Rito Ehrlich  [facility] Pager on [email or web address]  04/07/2019, 9:54 AM

## 2019-04-08 LAB — FERRITIN: Ferritin: 573 ng/mL — ABNORMAL HIGH (ref 24–336)

## 2019-04-08 LAB — CBC WITH DIFFERENTIAL/PLATELET
Abs Immature Granulocytes: 0.06 10*3/uL (ref 0.00–0.07)
Basophils Absolute: 0 10*3/uL (ref 0.0–0.1)
Basophils Relative: 0 %
Eosinophils Absolute: 0 10*3/uL (ref 0.0–0.5)
Eosinophils Relative: 0 %
HCT: 38.7 % — ABNORMAL LOW (ref 39.0–52.0)
Hemoglobin: 12.5 g/dL — ABNORMAL LOW (ref 13.0–17.0)
Immature Granulocytes: 1 %
Lymphocytes Relative: 5 %
Lymphs Abs: 0.3 10*3/uL — ABNORMAL LOW (ref 0.7–4.0)
MCH: 29.1 pg (ref 26.0–34.0)
MCHC: 32.3 g/dL (ref 30.0–36.0)
MCV: 90.2 fL (ref 80.0–100.0)
Monocytes Absolute: 0.2 10*3/uL (ref 0.1–1.0)
Monocytes Relative: 3 %
Neutro Abs: 5.2 10*3/uL (ref 1.7–7.7)
Neutrophils Relative %: 91 %
Platelets: 210 10*3/uL (ref 150–400)
RBC: 4.29 MIL/uL (ref 4.22–5.81)
RDW: 13.8 % (ref 11.5–15.5)
WBC: 5.7 10*3/uL (ref 4.0–10.5)
nRBC: 0 % (ref 0.0–0.2)

## 2019-04-08 LAB — COMPREHENSIVE METABOLIC PANEL
ALT: 14 U/L (ref 0–44)
AST: 24 U/L (ref 15–41)
Albumin: 2.9 g/dL — ABNORMAL LOW (ref 3.5–5.0)
Alkaline Phosphatase: 45 U/L (ref 38–126)
Anion gap: 10 (ref 5–15)
BUN: 47 mg/dL — ABNORMAL HIGH (ref 8–23)
CO2: 26 mmol/L (ref 22–32)
Calcium: 9.5 mg/dL (ref 8.9–10.3)
Chloride: 108 mmol/L (ref 98–111)
Creatinine, Ser: 1.19 mg/dL (ref 0.61–1.24)
GFR calc Af Amer: >60 mL/min (ref >60)
GFR calc non Af Amer: 55 mL/min — ABNORMAL LOW (ref >60)
Glucose, Bld: 135 mg/dL — ABNORMAL HIGH (ref 70–99)
Potassium: 4.1 mmol/L (ref 3.5–5.1)
Sodium: 144 mmol/L (ref 135–145)
Total Bilirubin: 0.9 mg/dL (ref 0.3–1.2)
Total Protein: 6 g/dL — ABNORMAL LOW (ref 6.5–8.1)

## 2019-04-08 LAB — C-REACTIVE PROTEIN: CRP: 6.3 mg/dL — ABNORMAL HIGH (ref <1.0)

## 2019-04-08 LAB — BPAM FFP
Blood Product Expiration Date: 202012132213
ISSUE DATE / TIME: 202012122226
Unit Type and Rh: 5100

## 2019-04-08 LAB — PREPARE FRESH FROZEN PLASMA: Unit division: 0

## 2019-04-08 LAB — D-DIMER, QUANTITATIVE: D-Dimer, Quant: 1.69 ug/mL-FEU — ABNORMAL HIGH (ref 0.00–0.50)

## 2019-04-08 MED ORDER — AMLODIPINE BESYLATE 5 MG PO TABS
5.0000 mg | ORAL_TABLET | Freq: Every day | ORAL | Status: DC
  Administered 2019-04-08 – 2019-04-15 (×8): 5 mg via ORAL
  Filled 2019-04-08 (×8): qty 1

## 2019-04-08 NOTE — Evaluation (Signed)
Physical Therapy Evaluation Patient Details Name: Cody Bradshaw MRN: [id number] DOB: 12-Nov-1929 Today's Date: 04/08/2019   History of Present Illness  Pt is an 85 y.o. male with recent ED visit for upper respiratory symptoms with (+) COVID test (12/9) and d/c home, now admitted 04/05/19 with AMS and hypoxia. CXR showed bibasilar infiltrates. PMH includes dementia, HOH.    Clinical Impression  Pt presents with an overall decrease in functional mobility secondary to above. Pt very HOH and difficult to communicate with; OT spoke with daughter who reports pt independent with mobility and ADLs. Today, pt able to ambulate with minA. SpO2 down to 70s on 5L, returning to >/88% on 6L with seated rest; returned to 5L at end of session with sitter present. Daughter reports she plans to have 24/7 assist available so pt can return home. Will follow acutely to address established goals.    Follow Up Recommendations Home health PT;Supervision/Assistance - 24 hour    Equipment Recommendations  Rolling walker with 5" wheels;3in1 (PT)(wheelchair?)    Recommendations for Other Services       Precautions / Restrictions Precautions Precautions: Fall;Other (comment) Precaution Comments: Baseline dementia, VERY HOH Restrictions Weight Bearing Restrictions: No      Mobility  Bed Mobility Overal bed mobility: Needs Assistance Bed Mobility: Supine to Sit     Supine to sit: Min guard;HOB elevated     General bed mobility comments: Min Guard A for safety and HOB elevated to support trunk.   Transfers Overall transfer level: Needs assistance Equipment used: 1 person hand held assist Transfers: Sit to/from Stand Sit to Stand: Min assist         General transfer comment: MinA and HHA to stabilize once in standing. Pt presenting with slight posterior lean with standing balance  Ambulation/Gait Ambulation/Gait assistance: Min assist Gait Distance (Feet): 36 Feet Assistive device: 1 person hand  held assist Gait Pattern/deviations: Step-through pattern;Decreased stride length;Trunk flexed   Gait velocity interpretation: <1.31 ft/sec, indicative of household ambulator General Gait Details: Slow, unsteady gait with minA and HHA to maintain balance; pt requesting seated rest, limited by fatigue  Stairs            Wheelchair Mobility    Modified Rankin (Stroke Patients Only)       Balance Overall balance assessment: Needs assistance Sitting-balance support: No upper extremity supported;Feet supported Sitting balance-Leahy Scale: Fair     Standing balance support: No upper extremity supported;During functional activity Standing balance-Leahy Scale: Poor Standing balance comment: Reliant on UE support or physical A                             Pertinent Vitals/Pain Pain Assessment: Faces Faces Pain Scale: No hurt Pain Intervention(s): Monitored during session    Home Living Family/patient expects to be discharged to:: Private residence Living Arrangements: Spouse/significant other Available Help at Discharge: Family;Available 24 hours/day Type of Home: House Home Access: Stairs to enter Entrance Stairs-Rails: Right Entrance Stairs-Number of Steps: 1 Home Layout: One level Home Equipment: Bedside commode;Shower seat Additional Comments: information collected from [employer], Mliss Sax. Daughter reports she plans to get 24/7 so he can d/c to home. Wife currently working with [facility] services, pt reports she uses RW    Prior Function Level of Independence: Needs assistance   Gait / Transfers Assistance Needed: Pt denies use of RW. Daughter reports pt independent with mobility and ADLs           Hand  Dominance   Dominant Hand: Right    Extremity/Trunk Assessment   Upper Extremity Assessment Upper Extremity Assessment: Generalized weakness    Lower Extremity Assessment Lower Extremity Assessment: Generalized weakness    Cervical / Trunk  Assessment Cervical / Trunk Assessment: Kyphotic  Communication   Communication: HOH  Cognition Arousal/Alertness: Awake/alert Behavior During Therapy: WFL for tasks assessed/performed Overall Cognitive Status: History of cognitive impairments - at baseline                                 General Comments: Baseline dementia. Pt following visual cues with increased time and cuing. Communication also impacted by [facility], difficulty reading when attempted to communicate by writing on paper. Pt abel to perform simple ADLs once task is initated      General Comments General comments (skin integrity, edema, etc.): SpO2 maintaining >88% on 5L during ADLs and mobility. Once seated in recliner, pt presenting with fatigue and SpO2 dropping to 70s on 5L. Pt requiring seated rest break and 6L to recover back to 88% SpO2. Returned pt to 5L O2.    Exercises     Assessment/Plan    PT Assessment Patient needs continued PT services  PT Problem List Decreased activity tolerance;Decreased strength;Decreased balance;Decreased mobility;Decreased cognition;Decreased knowledge of use of DME;Cardiopulmonary status limiting activity       PT Treatment Interventions DME instruction;Gait training;Stair training;Functional mobility training;Therapeutic activities;Therapeutic exercise;Balance training;Patient/family education    PT Goals (Current goals can be found in the Care Plan section)  Acute Rehab PT Goals Patient Stated Goal: Return home with 24/7 assist PT Goal Formulation: With family Time For Goal Achievement: 03/31/2019 Potential to Achieve Goals: Good    Frequency Min 3X/week   Barriers to discharge        Co-evaluation               AM-PAC PT "6 Clicks" Mobility  Outcome Measure Help needed turning from your back to your side while in a flat bed without using bedrails?: None Help needed moving from lying on your back to sitting on the side of a flat bed without using  bedrails?: A Little Help needed moving to and from a bed to a chair (including a wheelchair)?: A Little Help needed standing up from a chair using your arms (e.g., wheelchair or bedside chair)?: A Little Help needed to walk in hospital room?: A Little Help needed climbing 3-5 steps with a railing? : A Lot 6 Click Score: 18    End of Session Equipment Utilized During Treatment: Oxygen Activity Tolerance: Patient tolerated treatment well;Patient limited by fatigue Patient left: in chair;with call bell/phone within reach;with nursing/sitter in room Nurse Communication: Mobility status PT Visit Diagnosis: Other abnormalities of gait and mobility (R26.89);Muscle weakness (generalized) (M62.81)    Time: 7253-6644 PT Time Calculation (min) (ACUTE ONLY): 22 min   Charges:   PT Evaluation $PT Eval Moderate Complexity: 1 Mod     Ina Homes, PT, DPT Acute Rehabilitation Services  Pager [phone] Office [phone]  Malachy Chamber 04/08/2019, 5:33 PM

## 2019-04-08 NOTE — Progress Notes (Signed)
PROGRESS NOTE  Cody Bradshaw ZOX:[id number] DOB: [birth year] DOA: 04/05/2019  PCP: System, Provider Not In  Brief History/Interval Summary:  85 y.o. male, dementia, HOH who presented as a transfer from [facility] with respiratory failure with hypoxia secondary to COVID-19 infection.  Patient had been complaining of upper respiratory symptoms for several days, he tested positive for COVID-19 2 days prior to admission.  Was evaluated in the emergency room at that time, he was not requiring oxygen and his symptoms were relatively mild so he was discharged home.  EMS was called to the patient's residence due to altered mental status, upon arrival patient was febrile with a temperature of 104.5 F, hypoxic with oxygen saturation of 64% on room air.  Patient was placed on oxygen, given Tylenol and transferred to [facility] ER.  Chest x-ray showed bibasilar infiltrates.  Patient was transferred here for further management.  Reason for Visit: Acute respiratory failure with hypoxia.  Pneumonia due to COVID-19.  Consultants: None  Procedures: None  Antibiotics: Anti-infectives (From admission, onward)   Start     Dose/Rate Route Frequency Ordered Stop   04/06/19 1000  remdesivir 100 mg in sodium chloride 0.9 % 100 mL IVPB     100 mg 200 mL/hr over 30 Minutes Intravenous Daily 04/06/19 0121 04/10/19 0959      Subjective/Interval History: Patient very hard of hearing.  Seems to be much more calmer and comfortable today.  Denies any complaints.  Not very communicative.  Sitter is at the bedside.    Assessment/Plan:  Acute Hypoxic Resp. Failure/Pneumonia due to COVID-19  COVID-19 Labs  Recent Labs  Lab 04/06/19 0420 04/07/19 0158 04/08/19 0535  DDIMER 1.67* 1.69* 1.69*  FERRITIN 480* 683* 573*  CRP 18.8* 13.3* 6.3*  ALT PROCALCITON 0.16  --   --    Positive COVID-19 test result at [facility].  Objective findings: Fever: He is normotensive now.  Should be  okay to discontinue the warming blankets. Oxygen requirements: Flow nasal cannula.  3 to 5 L/min.  Saturating in the mid 90s.   COVID 19 Therapeutics: Antibacterials: Patient was given ceftriaxone and azithromycin at [facility].  Not continued. Remdesivir: Day 4 today.  First dose was at [facility]. Steroids: Dexamethasone 6 mg every 12 hours. Diuretics: Not on diuretics on a scheduled basis here Actemra: 1 dose given on 12/12 Convalescent Plasma: Administered on 12/13 Vitamin C and Zinc: Continue PUD Prophylaxis: Pepcid DVT Prophylaxis:  Lovenox 40 mg daily  Patient seems to be stable from a respiratory standpoint.  Still requiring 3 to 5 L of oxygen by nasal cannula.  However he has improved from the time of admission.  He has received Actemra and convalescent plasma.  He remains on remdesivir and steroids.  CRP has improved to 6.3.  Ferritin 573.  D-dimer 1.69.  Continue current management.  Incentive spirometry, mobilization, out of bed to chair.  Prone positioning as tolerated.  Chest x-ray done yesterday morning showed stable findings.  Acute metabolic encephalopathy Occasional sundowning noted.  Mentation seems to be stable.  Calm this morning.  No focal neurological deficits.    Bacteremia Fax report from [facility] reviewed that patient had gram-positive cocci.  Unclear how many sets of blood cultures were positive.  Was hypothermic here initially.  However his WBC was normal.  Procalcitonin was only 0.16.  That could have been a contaminant.  Blood cultures were repeated.  Hold off on antibiotics for now.  WBC remains  normal.  Will need to call [facility] to find out the final results from the blood cultures.    Hypothermia Patient apparently was febrile when evaluated by EMS.  Subsequently noted to be hypothermic.  Possibly due to viral illness.  Has improved.  Warming blankets can be discontinued.  Bradycardia Patient not on any systemic AV blocking agents.   He is noted to be on timolol eyedrops.  This was held.  EKG shows first-degree AV block.  RBBB.  Nonspecific ST and T wave changes.  Heart rate is stable.  Patient remains asymptomatic.Marland Kitchen    Essential hypertension High blood pressure readings noted this morning.  We will resume his amlodipine.  He was also on lisinopril/HCTZ at home.  History of GERD Pepcid  History of dementia  Currently stable.  He is very hard of hearing which makes it communicating with him very challenging.  Continue memantine.  History of glaucoma Holding timolol due to bradycardia.  Continue other eyedrops.  Abnormal thyroid function test TSH is low.  Free T4 is noted to be high.  No clear evidence for any symptoms or signs of hyperthyroidism.  Will recommend that these levels be rechecked in a few weeks after discharge.  Hypokalemia Repleted.  Normocytic anemia No evidence of overt bleeding.  Hemoglobin is stable.     DVT Prophylaxis: Lovenox Code Status: Full code Family Communication: Wife being updated on a daily basis Disposition Plan: PT and OT evaluation.   Medications:  Scheduled: . brinzolamide  1 drop Both Eyes TID   And  . brimonidine  1 drop Both Eyes TID  . dexamethasone (DECADRON) injection  6 mg Intravenous Q12H  . docusate sodium  100 mg Oral Daily  . enoxaparin (LOVENOX) injection  40 mg Subcutaneous Q24H  . famotidine  20 mg Oral Daily  . feeding supplement (ENSURE ENLIVE)  237 mL Oral TID BM  . Ipratropium-Albuterol  1 puff Inhalation Q6H  . latanoprost  1 drop Both Eyes QHS  . memantine  10 mg Oral BID  . QUEtiapine  25 mg Oral QHS  . vitamin C  500 mg Oral Daily  . zinc sulfate  220 mg Oral Daily   Continuous: . remdesivir 100 mg in NS 100 mL 100 mg (04/08/19 0932)   WUJ:WJXBJYNWGNFAO, chlorpheniramine-HYDROcodone, guaiFENesin-dextromethorphan, haloperidol lactate, polyvinyl alcohol   Objective:  Vital Signs  Vitals:   04/07/19 1559 04/07/19 2017 04/08/19 0539  04/08/19 0756  BP: 123/71 (!) 119/50 (!) 151/63 (!) 174/69  Pulse: (!) 52 (!) 54 62 (!) 52  Resp: 19 17 (!) 22 (!) 31  Temp: 97.6 F (36.4 C) 97.8 F (36.6 C) 98.4 F (36.9 C) 97.9 F (36.6 C)  TempSrc: Oral Oral Oral Axillary  SpO2: 96% 97% 93% 91%  Weight:      Height:        Intake/Output Summary (Last 24 hours) at 04/08/2019 1044 Last data filed at 04/08/2019 0710 Gross per 24 hour  Intake 100 ml  Output 700 ml  Net -600 ml   Filed Weights   04/06/19 0100  Weight: 68 kg    General appearance: Awake alert.  In no distress.  Hard of hearing Resp: Normal effort at rest.  Coarse breath sound bilaterally.  Few crackles at the bases.  No wheezing or rhonchi Cardio: S1-S2 is normal regular.  No S3-S4.  No rubs murmurs or bruit GI: Abdomen is soft.  Nontender nondistended.  Bowel sounds are present normal.  No masses organomegaly Extremities: No  edema.  Moving all his extremities. Neurologic: No obvious focal neurological deficits.    Lab Results:  Data Reviewed: I have personally reviewed following labs and imaging studies  CBC: Recent Labs  Lab 04/06/19 0420 04/07/19 0158 04/08/19 0535  WBC 4.5 5.0 5.7  NEUTROABS 3.8 4.5 5.2  HGB 12.6* 13.2 12.5*  HCT 38.9* 41.0 38.7*  MCV 90.0 90.7 90.2  PLT 177 206 210    Basic Metabolic Panel: Recent Labs  Lab 04/06/19 0420 04/07/19 0158 04/08/19 0535  NA 137 144 144  K 3.4* 4.0 4.1  CL 100 105 108  CO2 26 26 26   GLUCOSE 133* 165* 135*  BUN 42* 50* 47*  CREATININE 1.37* 1.32* 1.19  CALCIUM 9.0 9.5 9.5  MG  --  2.4  --     GFR: Estimated Creatinine Clearance: 42.1 mL/min (by C-G formula based on SCr of 1.19 mg/dL).  Liver Function Tests: Recent Labs  Lab 04/06/19 0420 04/07/19 0158 04/08/19 0535  AST 26 29 24   ALT 12 14 14   ALKPHOS 37* 44 45  BILITOT 0.6 0.7 0.9  PROT 6.1* 6.5 6.0*  ALBUMIN 2.6* 3.0* 2.9*    CBG: Recent Labs  Lab 04/06/19 0033 04/06/19 0854  GLUCAP 138* 115*     Thyroid  Function Tests: Recent Labs    04/06/19 0420 04/07/19 0158  TSH 0.257*  --   FREET4  --  1.87*    Anemia Panel: Recent Labs    04/07/19 0158 04/08/19 0535  FERRITIN 683* 573*    Recent Results (from the past 240 hour(s))  Culture, blood (routine x 2)     Status: None (Preliminary result)   Collection Time: 04/07/19  9:20 AM   Specimen: BLOOD  Result Value Ref Range Status   Specimen Description   Final    BLOOD RIGHT ANTECUBITAL Performed at [facility], [address] [place], [place] [address]    Special Requests   Final    BOTTLES DRAWN AEROBIC ONLY Blood Culture adequate volume Performed at [facility], [address] [place], [place] [address]    Culture   Final    NO GROWTH < 24 HOURS Performed at [facility], [address] [place], [place] [address]    Report Status PENDING  Incomplete  Culture, blood (routine x 2)     Status: None (Preliminary result)   Collection Time: 04/07/19  9:25 AM   Specimen: BLOOD  Result Value Ref Range Status   Specimen Description   Final    BLOOD LEFT ANTECUBITAL Performed at [facility], [address] [place], [place] [address]    Special Requests   Final    BOTTLES DRAWN AEROBIC ONLY Blood Culture adequate volume Performed at [facility], [address] [place], [place] [address]    Culture   Final    NO GROWTH < 24 HOURS Performed at [facility], [address] [place], [place] [address]    Report Status PENDING  Incomplete      Radiology Studies: DG CHEST PORT 1 VIEW  Result Date: 04/07/2019 CLINICAL DATA:  Pneumonia due to COVID-19 virus. EXAM: PORTABLE CHEST 1 VIEW COMPARISON:  April 05, 2019. FINDINGS: Stable cardiomediastinal silhouette. Stable bibasilar opacities are noted concerning for pneumonia. Atherosclerosis of thoracic aorta is noted. No pneumothorax or pleural effusion is noted. Bony thorax is  unremarkable. IMPRESSION: Aortic atherosclerosis. Stable bibasilar opacities are noted concerning for pneumonia. Follow-up radiographs  are recommended to ensure resolution. Electronically Signed   By: Marijo Conception M.D.   On: 04/07/2019 08:58       LOS: 3 days   [facility] Pager on [email or web address]  04/08/2019, 10:44 AM

## 2019-04-08 NOTE — Evaluation (Signed)
Occupational Therapy Evaluation Patient Details Name: Cody DalesJoseph Bradshaw MRN: [id number] DOB: [birth year] Today's Date: 04/08/2019    History of Present Illness 85 y.o. male presenting via EMS due to AMS and hypoxia. with SpO2 at 64% on RA. Pt with recent ED visit for upper respiratory symptoms and tested COIVD + two; dc to home. PMH including dementia and HOH.   Clinical Impression   PTA, pt was living with his wife and assume he was performing ADLs. Pt currently requiring Min A for functional mobility and ADLs. Pt presenting with decreased balance and activity tolerance. SpO2 dropping to 77% on 5L O2 at end of activity; requiring seated rest break and 6L O2 to recover back to >88%. Pt would benefit from further acute OT to facilitate safe dc. Recommend dc to home with HHOT for further OT to optimize safety, independence with ADLs, and return to PLOF.    Update @ 14:30: called daughter, Cody Bradshaw, and discussed home set up and PLOF. She reports pt was independent with ADLs and mobility. Also Cody Bradshaw plans to organize 24/7 support, so he can safely dc to home.     Follow Up Recommendations  Home health OT;Supervision/Assistance - 24 hour    Equipment Recommendations  3 in 1 bedside commode    Recommendations for Other Services PT consult     Precautions / Restrictions Precautions Precautions: Fall;Other (comment)(Baseline dementia. Very HOH)      Mobility Bed Mobility Overal bed mobility: Needs Assistance Bed Mobility: Supine to Sit     Supine to sit: Min guard;HOB elevated     General bed mobility comments: Min Guard A for safety and HOB elevated to support trunk.   Transfers Overall transfer level: Needs assistance Equipment used: None Transfers: Sit to/from Stand Sit to Stand: Min assist         General transfer comment: Min A to stabilize once in standing. Pt presenting with slight posterior lean with standing balance    Balance Overall balance assessment: Needs  assistance Sitting-balance support: No upper extremity supported;Feet supported Sitting balance-Leahy Scale: Fair     Standing balance support: No upper extremity supported;During functional activity Standing balance-Leahy Scale: Poor Standing balance comment: Reliant on UE support or physical A                           ADL either performed or assessed with clinical judgement   ADL Overall ADL's : Needs assistance/impaired Eating/Feeding: Set up;Sitting   Grooming: Oral care;Standing;Minimal assistance Grooming Details (indicate cue type and reason): Requiring Min A for standing balance with slight posterior lean. Pt requiring Max cues to understand what OT was asking of him (walk to sink and brush teeth), but once he initated task, pt able to perform oral care and asked appriopiate questions.  Upper Body Bathing: Minimal assistance;Sitting   Lower Body Bathing: Minimal assistance;Sit to/from stand   Upper Body Dressing : Minimal assistance;Sitting   Lower Body Dressing: Minimal assistance;Sit to/from stand   Toilet Transfer: Minimal assistance;Ambulation(simulated to recliner)           Functional mobility during ADLs: Minimal assistance General ADL Comments: Pt presenting with decreased balance and activity tolerance     Vision Baseline Vision/History: (unsure)       Perception     Praxis      Pertinent Vitals/Pain Pain Assessment: Faces Faces Pain Scale: No hurt Pain Intervention(s): Monitored during session     Hand Dominance Right   Extremity/Trunk Assessment Upper  Extremity Assessment Upper Extremity Assessment: Generalized weakness   Lower Extremity Assessment Lower Extremity Assessment: Defer to PT evaluation   Cervical / Trunk Assessment Cervical / Trunk Assessment: Kyphotic   Communication Communication Communication: HOH   Cognition Arousal/Alertness: Awake/alert Behavior During Therapy: WFL for tasks assessed/performed Overall  Cognitive Status: History of cognitive impairments - at baseline                                 General Comments: Baseline dementia. Pt following visual cues with increased time and cuing. Communication also impacted by [facility]. Pt abel to perform simple ADLs once task is initated   General Comments  SpO2 maintaining >88% on 5L during ADLs and mobility. Once seated in recliner, pt presenting with fatigue and SpO2 dropping to 70s on 5L. Pt requiring seated rest break and 6L to recover back to 88% SpO2. Returned pt to 5L O2.     Exercises Exercises: Other exercises Other Exercises Other Exercises: Provided IS and flutter valve. Educated pt on use of flutter valve and he performed once at end of session.    Shoulder Instructions      Home Living Family/patient expects to be discharged to:: Unsure Living Arrangements: Spouse/significant other                               Additional Comments: Pt very HOH and difficulty to communicate for gathering information. Pt was able to report that he lives with his wife [place].      Prior Functioning/Environment Level of Independence: Needs assistance        Comments: Assuming pt not using AE at home as he declined to use walker and stated "Mitzi Davenport uses one of those."        OT Problem List: Decreased strength;Decreased range of motion;Decreased activity tolerance;Impaired balance (sitting and/or standing);Decreased safety awareness;Decreased knowledge of use of DME or AE;Decreased knowledge of precautions;Cardiopulmonary status limiting activity;Impaired UE functional use      OT Treatment/Interventions: Self-care/ADL training;Therapeutic exercise;Energy conservation;DME and/or AE instruction;Therapeutic activities;Patient/family education    OT Goals(Current goals can be found in the care plan section) Acute Rehab OT Goals Patient Stated Goal: unstated OT Goal Formulation: Patient unable to participate in goal  setting Time For Goal Achievement: 2019-05-01 Potential to Achieve Goals: Good  OT Frequency: Min 2X/week   Barriers to D/C:            Co-evaluation              AM-PAC OT "6 Clicks" Daily Activity     Outcome Measure Help from another person eating meals?: None Help from another person taking care of personal grooming?: A Little Help from another person toileting, which includes using toliet, bedpan, or urinal?: A Little Help from another person bathing (including washing, rinsing, drying)?: A Little Help from another person to put on and taking off regular upper body clothing?: A Little Help from another person to put on and taking off regular lower body clothing?: A Little 6 Click Score: 19   End of Session Equipment Utilized During Treatment: Oxygen(5-6L) Nurse Communication: Mobility status  Activity Tolerance: Patient limited by fatigue Patient left: in chair;with call bell/phone within reach;with nursing/sitter in room  OT Visit Diagnosis: Unsteadiness on feet (R26.81);Other abnormalities of gait and mobility (R26.89);Muscle weakness (generalized) (M62.81);Other symptoms and signs involving cognitive function  Time: 1638-4665 OT Time Calculation (min): 22 min Charges:  OT General Charges $OT Visit: 1 Visit OT Evaluation $OT Eval Moderate Complexity: [place], OTR/L Acute Rehab Pager: [phone] Office: [place] 04/08/2019, 10:51 AM

## 2019-04-09 DIAGNOSIS — I10 Essential (primary) hypertension: Secondary | ICD-10-CM

## 2019-04-09 LAB — CBC WITH DIFFERENTIAL/PLATELET
Abs Immature Granulocytes: 0.16 10*3/uL — ABNORMAL HIGH (ref 0.00–0.07)
Basophils Absolute: 0 10*3/uL (ref 0.0–0.1)
Basophils Relative: 1 %
Eosinophils Absolute: 0 10*3/uL (ref 0.0–0.5)
Eosinophils Relative: 0 %
HCT: 40.8 % (ref 39.0–52.0)
Hemoglobin: 12.9 g/dL — ABNORMAL LOW (ref 13.0–17.0)
Immature Granulocytes: 3 %
Lymphocytes Relative: 6 %
Lymphs Abs: 0.3 10*3/uL — ABNORMAL LOW (ref 0.7–4.0)
MCH: 28.8 pg (ref 26.0–34.0)
MCHC: 31.6 g/dL (ref 30.0–36.0)
MCV: 91.1 fL (ref 80.0–100.0)
Monocytes Absolute: 0.2 10*3/uL (ref 0.1–1.0)
Monocytes Relative: 3 %
Neutro Abs: 4.6 10*3/uL (ref 1.7–7.7)
Neutrophils Relative %: 87 %
Platelets: 236 10*3/uL (ref 150–400)
RBC: 4.48 MIL/uL (ref 4.22–5.81)
RDW: 13.7 % (ref 11.5–15.5)
WBC: 5.2 10*3/uL (ref 4.0–10.5)
nRBC: 0 % (ref 0.0–0.2)

## 2019-04-09 LAB — COMPREHENSIVE METABOLIC PANEL
ALT: 41 U/L (ref 0–44)
AST: 48 U/L — ABNORMAL HIGH (ref 15–41)
Albumin: 3.1 g/dL — ABNORMAL LOW (ref 3.5–5.0)
Alkaline Phosphatase: 50 U/L (ref 38–126)
Anion gap: 10 (ref 5–15)
BUN: 50 mg/dL — ABNORMAL HIGH (ref 8–23)
CO2: 26 mmol/L (ref 22–32)
Calcium: 9.6 mg/dL (ref 8.9–10.3)
Chloride: 109 mmol/L (ref 98–111)
Creatinine, Ser: 1.19 mg/dL (ref 0.61–1.24)
GFR calc Af Amer: >60 mL/min (ref >60)
GFR calc non Af Amer: 55 mL/min — ABNORMAL LOW (ref >60)
Glucose, Bld: 168 mg/dL — ABNORMAL HIGH (ref 70–99)
Potassium: 4.1 mmol/L (ref 3.5–5.1)
Sodium: 145 mmol/L (ref 135–145)
Total Bilirubin: 0.8 mg/dL (ref 0.3–1.2)
Total Protein: 6.4 g/dL — ABNORMAL LOW (ref 6.5–8.1)

## 2019-04-09 LAB — D-DIMER, QUANTITATIVE: D-Dimer, Quant: 3.04 ug/mL-FEU — ABNORMAL HIGH (ref 0.00–0.50)

## 2019-04-09 LAB — FERRITIN: Ferritin: 502 ng/mL — ABNORMAL HIGH (ref 24–336)

## 2019-04-09 LAB — C-REACTIVE PROTEIN: CRP: 4.1 mg/dL — ABNORMAL HIGH (ref <1.0)

## 2019-04-09 MED ORDER — QUETIAPINE FUMARATE 25 MG PO TABS: 50.0000 mg | ORAL_TABLET | Freq: Every day | ORAL | Status: DC

## 2019-04-09 MED ORDER — QUETIAPINE FUMARATE 25 MG PO TABS
25.0000 mg | ORAL_TABLET | Freq: Every day | ORAL | Status: DC
  Administered 2019-04-10 – 2019-04-15 (×6): 25 mg via ORAL
  Filled 2019-04-09 (×6): qty 1

## 2019-04-09 MED ORDER — QUETIAPINE FUMARATE 25 MG PO TABS
75.0000 mg | ORAL_TABLET | Freq: Every day | ORAL | Status: DC
  Administered 2019-04-09 – 2019-04-14 (×5): 75 mg via ORAL
  Filled 2019-04-09 (×5): qty 3
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
## 2019-04-09 NOTE — Progress Notes (Signed)
PROGRESS NOTE  Cody Bradshaw QZR:[id number] DOB: 1929-07-27 DOA: 04/05/2019  PCP: System, Provider Not In  Brief History/Interval Summary:  85 y.o. male, dementia, HOH who presented as a transfer from [facility] with respiratory failure with hypoxia secondary to COVID-19 infection.  Patient had been complaining of upper respiratory symptoms for several days, he tested positive for COVID-19 2 days prior to admission.  Was evaluated in the emergency room at that time, he was not requiring oxygen and his symptoms were relatively mild so he was discharged home.  EMS was called to the patient's residence due to altered mental status, upon arrival patient was febrile with a temperature of 104.5 F, hypoxic with oxygen saturation of 64% on room air.  Patient was placed on oxygen, given Tylenol and transferred to [facility] ER.  Chest x-ray showed bibasilar infiltrates.  Patient was transferred here for further management.  Reason for Visit: Acute respiratory failure with hypoxia.  Pneumonia due to COVID-19.  Consultants: None  Procedures: None  Antibiotics: Anti-infectives (From admission, onward)   Start     Dose/Rate Route Frequency Ordered Stop   04/06/19 1000  remdesivir 100 mg in sodium chloride 0.9 % 100 mL IVPB     100 mg 200 mL/hr over 30 Minutes Intravenous Daily 04/06/19 0121 04/09/19 1028      Subjective/Interval History: Patient very hard of hearing.  Very somnolent this morning.  Apparently he did not get any sleep last night and started sleeping just a short while ago.  Sitter is at the bedside.      Assessment/Plan:  Acute Hypoxic Resp. Failure/Pneumonia due to COVID-19  COVID-19 Labs  Recent Labs  Lab 04/06/19 0420 04/07/19 0158 04/08/19 0535 04/09/19 0500  DDIMER 1.67* 1.69* 1.69* 3.04*  FERRITIN 480* 683* 573* 502*  CRP 18.8* 13.3* 6.3* 4.1*  ALT 12 14 14  41  PROCALCITON 0.16  --   --   --    [facility]19 [facility] at [facility].  Objective findings: Fever: Was initially hypothermic.  Temperatures have normalized in the last 48 hours.  Warming blankets were discontinued. Oxygen requirements: High flow nasal cannula.  4 to 6 L/min.  Saturating in the 90s.    COVID 19 Therapeutics: Antibacterials: Patient was given ceftriaxone and azithromycin at [facility].  Not continued. Remdesivir: Day 5 today.  First dose was at [facility]. Steroids: Dexamethasone 6 mg every 12 hours. Diuretics: Not on diuretics on a scheduled basis here Actemra: 1 dose given on 12/12 Convalescent Plasma: Administered on 12/13 Vitamin C and Zinc: Continue PUD Prophylaxis: Pepcid DVT Prophylaxis:  Lovenox 40 mg daily  Patient seems to be stable for the most part from a respiratory standpoint.  Still requiring anywhere from 3 to 6 L of oxygen by nasal cannula.  He is noted to be saturating in the mid 90s.  He could be weaned down further.  He has received Actemra and convalescent plasma.  He will complete course of remdesivir today.  CRP has improved to 4.1.  D-dimer noted to have jumped up to 3.04.  We will continue to monitor for now.  Continue current dose of Lovenox.  Continue incentive spirometry mobilization out of bed to chair and prone positioning as tolerated.  Chest x-ray done 2 days ago showed stable findings.  Acute metabolic encephalopathy Appears that his sleep cycle has reversed.  He is noted to be somnolent this morning.  No obvious focal deficits.  Continue to monitor.  Consider increasing dose of  Seroquel.  Coag negative staph bacteremia, contaminant Fax [facility] reviewed that patient had gram-positive cocci.  Patient's WBC was normal.  He was hypothermic.  He was monitored.  Cultures were repeated here.  [facility].  They have identified coag negative staph only in 1 set.  This is most likely a contaminant.  No further management for now.  Wait on our  cultures as well.  They are negative so far.  Hypothermia Patient apparently was febrile when evaluated by EMS.  Subsequently noted to be hypothermic.  Possibly due to viral illness.  Now resolved.    Bradycardia Patient not on any systemic AV blocking agents.  He is noted to be on timolol eyedrops.  This was held.  EKG shows first-degree AV block.  RBBB.  Nonspecific ST and T wave changes.  Heart rate remained stable.  Continue to monitor.  Essential hypertension Amlodipine was resumed.  Continue to monitor.  He was also on lisinopril/HCTZ at home.  That remains on hold.  History of GERD Pepcid  History of dementia  Currently stable.  He is very hard of hearing which makes it communicating with him very challenging.  Continue memantine.  History of glaucoma Holding timolol due to bradycardia.  Continue other eyedrops.  Abnormal thyroid function test TSH is low.  Free T4 is noted to be high.  No clear evidence for any symptoms or signs of hyperthyroidism.  Will recommend that these levels be rechecked in a few weeks after discharge.  Hypokalemia Repleted.  Normocytic anemia No evidence of overt bleeding.  Hemoglobin is stable.     DVT Prophylaxis: Lovenox Code Status: Full code Family Communication: Wife being updated on a daily basis Disposition Plan: PT and OT evaluation.   Medications:  Scheduled: . amLODipine  5 mg Oral Daily  . brinzolamide  1 drop Both Eyes TID   And  . brimonidine  1 drop Both Eyes TID  . dexamethasone (DECADRON) injection  6 mg Intravenous Q12H  . docusate sodium  100 mg Oral Daily  . enoxaparin (LOVENOX) injection  40 mg Subcutaneous Q24H  . famotidine  20 mg Oral Daily  . feeding supplement (ENSURE ENLIVE)  237 mL Oral TID BM  . Ipratropium-Albuterol  1 puff Inhalation Q6H  . latanoprost  1 drop Both Eyes QHS  . memantine  10 mg Oral BID  . QUEtiapine  25 mg Oral QHS  . vitamin C  500 mg Oral Daily  . zinc sulfate  220 mg Oral Daily    Continuous:  ZOX:[id number]:acetaminophen, chlorpheniramine-HYDROcodone, guaiFENesin-dextromethorphan, haloperidol lactate, polyvinyl alcohol   Objective:  Vital Signs  Vitals:   04/08/19 2320 04/08/19 2330 04/09/19 0533 04/09/19 0831  BP: 133/62  (!) 120/102 (!) 156/72  Pulse: (!) 52 (!) 54 (!) 49 85  Resp: (!) 21 19 20  (!) 25  Temp: 98.2 F (36.8 C) 98.5 F (36.9 C) 98.2 F (36.8 C) 98.3 F (36.8 C)  TempSrc: Axillary Oral Oral Oral  SpO2: 100% 95% 93% 96%  Weight:      Height:        Intake/Output Summary (Last 24 hours) at 04/09/2019 1058 Last data filed at 04/09/2019 0705 Gross per 24 hour  Intake 320 ml  Output 600 ml  Net -280 ml   Filed Weights   04/06/19 0100  Weight: 68 kg    General appearance: Somnolent this morning.  No distress. Resp: Normal effort.  Coarse breath sound bilaterally with  few crackles at the bases.  No wheezing or rhonchi. Cardio: S1-S2 is bradycardic regular.  No S3-S4.  No rubs or bruit. GI: Abdomen is soft.  Nontender nondistended.  Bowel sounds are present normal.  No masses organomegaly Extremities: No edema.   Neurologic: Pleasantly confused.  No focal neurological deficits.     Lab Results:  Data Reviewed: I have personally reviewed following labs and imaging studies  CBC: Recent Labs  Lab 04/06/19 0420 04/07/19 0158 04/08/19 0535 04/09/19 0500  WBC 4.5 5.0 5.7 5.2  NEUTROABS 3.8 4.5 5.2 4.6  HGB 12.6* 13.2 12.5* 12.9*  HCT 38.9* 41.0 38.7* 40.8  MCV 90.0 90.7 90.2 91.1  PLT 177 206 210 161    Basic Metabolic Panel: Recent Labs  Lab 04/06/19 0420 04/07/19 0158 04/08/19 0535 04/09/19 0500  NA 137 144 144 145  K 3.4* 4.0 4.1 4.1  CL 100 105 108 109  CO2 26 26 26 26   GLUCOSE 133* 165* 135* 168*  BUN 42* 50* 47* 50*  CREATININE 1.37* 1.32* 1.19 1.19  CALCIUM 9.0 9.5 9.5 9.6  MG  --  2.4  --   --     GFR: Estimated Creatinine Clearance: 42.1 mL/min (by C-G formula based on SCr of 1.19 mg/dL).  Liver Function  Tests: Recent Labs  Lab 04/06/19 0420 04/07/19 0158 04/08/19 0535 04/09/19 0500  AST 26 29 24  48*  ALT 12 14 14  41  ALKPHOS 37* 44 45 50  BILITOT 0.6 0.7 0.9 0.8  PROT 6.1* 6.5 6.0* 6.4*  ALBUMIN 2.6* 3.0* 2.9* 3.1*    CBG: Recent Labs  Lab 04/06/19 0033 04/06/19 0854  GLUCAP 138* 115*     Thyroid Function Tests: Recent Labs    04/07/19 0158  FREET4 1.87*    Anemia Panel: Recent Labs    04/08/19 0535 04/09/19 0500  FERRITIN 573* 502*    Recent Results (from the past 240 hour(s))  Culture, blood (routine x 2)     Status: None (Preliminary result)   Collection Time: 04/07/19  9:20 AM   Specimen: BLOOD  Result Value Ref Range Status   Specimen Description   Final    BLOOD RIGHT ANTECUBITAL Performed at [facility], [place] [address] [place], [place] [address]    Special Requests   Final    BOTTLES DRAWN AEROBIC ONLY Blood Culture adequate volume Performed at [facility] [address] [place], [place] [address]    Culture   Final    NO GROWTH < 24 HOURS Performed at [facility] [address], [place], [place] [address]    Report Status PENDING  Incomplete  Culture, blood (routine x 2)     Status: None (Preliminary result)   Collection Time: 04/07/19  9:25 AM   Specimen: BLOOD  Result Value Ref Range Status   Specimen Description   Final    BLOOD LEFT ANTECUBITAL Performed at [place] [address], [place], [place] [address]    Special Requests   Final    BOTTLES DRAWN AEROBIC ONLY Blood Culture adequate volume Performed at [facility] [address] [place], [place] [address]    Culture   Final    NO GROWTH < 24 HOURS Performed at [facility] [address] [place], [place] [address]    Report Status PENDING  Incomplete      Radiology Studies: No results found.     LOS: 4 days  Lujuana Kapler [employer]  [employer] on  [email or web address]  04/09/2019, 10:58 AM

## 2019-04-09 NOTE — Progress Notes (Addendum)
Physical Therapy Treatment Patient Details Name: Cody Bradshaw MRN: [id number] DOB: 07/14/1929 Today's Date: 04/09/2019    History of Present Illness Pt is an 85 y.o. male with recent ED visit for upper respiratory symptoms with (+) COVID test (12/9) and d/c home, now admitted 04/05/19 with AMS and hypoxia. CXR showed bibasilar infiltrates. PMH includes dementia, HOH.   PT Comments    Pt more lethargic with increased confusion this session; per sitter, apparently did not sleep last night and very restless. Today, pt able to perform hallway ambulation with minA. Pt very HOH, following some visual/gestural commands, but not communicating as well as yesterday. Restless and difficult to redirect. Will continue to follow acutely.  SpO2 down to 82% on 4L HFNC with activity, eventually returning to 91% with seated rest HR 80s-152, afib    Follow Up Recommendations  Home health PT;Supervision/Assistance - 24 hour     Equipment Recommendations  Rolling walker with 5" wheels;3in1 (PT)(w/c?)    Recommendations for Other Services       Precautions / Restrictions Precautions Precautions: Fall;Other (comment) Precaution Comments: Baseline dementia, VERY HOH; safety sitter Restrictions Weight Bearing Restrictions: No    Mobility  Bed Mobility Overal bed mobility: Needs Assistance Bed Mobility: Sit to Supine       Sit to supine: Mod assist   General bed mobility comments: ModA to encourage return to supine as pt restless sitting EOB  Transfers Overall transfer level: Needs assistance Equipment used: 1 person hand held assist Transfers: Sit to/from Stand Sit to Stand: Min guard            Ambulation/Gait Ambulation/Gait assistance: Min assist Gait Distance (Feet): 100 Feet Assistive device: Rolling walker (2 wheeled);1 person hand held assist Gait Pattern/deviations: Step-through pattern;Decreased stride length;Trunk flexed   Gait velocity interpretation: 1.31 - 2.62  ft/sec, indicative of limited community ambulator General Gait Details: Provided RW in attempts at added stability, pt picking up back legs but rolling front wheels on ground, intermittent minA for balance and to guide pt in hallway; additional amb in room with HHA and minA. SpO2 down to 82% on 4L HFNC, prolonged seated rest to return to 91%   Stairs             Wheelchair Mobility    Modified Rankin (Stroke Patients Only)       Balance Overall balance assessment: Needs assistance Sitting-balance support: No upper extremity supported;Feet supported Sitting balance-Leahy Scale: Fair     Standing balance support: No upper extremity supported;During functional activity Standing balance-Leahy Scale: Poor Standing balance comment: Reliant on UE support or physical A                            Cognition Arousal/Alertness: Awake/alert;Lethargic Behavior During Therapy: Restless Overall Cognitive Status: History of cognitive impairments - at baseline                                 General Comments: Baseline dementia and communication impacted by very HOH. Pt following basic visual/gestural commands ~50% of session. Becoming restless at end of session attempting to get OOB bed, mumbling non-sensical speech. Safety sitter due to restlessness; reports pt apparently got no sleep last night      Exercises      General Comments General comments (skin integrity, edema, etc.): SpO2 down to 82% on 4L HFNC, prolonged seated rest to return to 91%  Pertinent Vitals/Pain Pain Assessment: Faces Faces Pain Scale: No hurt Pain Intervention(s): Monitored during session    Home Living                      Prior Function            PT Goals (current goals can now be found in the care plan section) Acute Rehab PT Goals Patient Stated Goal: Return home with 24/7 assist PT Goal Formulation: With family Time For Goal Achievement:   Potential to Achieve Goals: Good Progress towards PT goals: Progressing toward goals    Frequency    Min 3X/week      PT Plan Current plan remains appropriate    Co-evaluation              AM-PAC PT "6 Clicks" Mobility   Outcome Measure  Help needed turning from your back to your side while in a flat bed without using bedrails?: None Help needed moving from lying on your back to sitting on the side of a flat bed without using bedrails?: A Little Help needed moving to and from a bed to a chair (including a wheelchair)?: A Little Help needed standing up from a chair using your arms (e.g., wheelchair or bedside chair)?: A Little Help needed to walk in hospital room?: A Little Help needed climbing 3-5 steps with a railing? : A Lot 6 Click Score: 18    End of Session Equipment Utilized During Treatment: Oxygen Activity Tolerance: Patient limited by fatigue Patient left: in bed;with call bell/phone within reach;with nursing/sitter in room Nurse Communication: Mobility status PT Visit Diagnosis: Other abnormalities of gait and mobility (R26.89);Muscle weakness (generalized) (M62.81)     Time: 5956-3875 PT Time Calculation (min) (ACUTE ONLY): 24 min  Charges:  $Therapeutic Exercise: 8-22 mins $Therapeutic Activity: 8-22 mins                    Ina Homes, PT, DPT Acute Rehabilitation Services  Pager [phone] Office [phone]  Malachy Chamber 04/09/2019, 12:21 PM

## 2019-04-10 DIAGNOSIS — J1282 Pneumonia due to coronavirus disease 2019: Secondary | ICD-10-CM

## 2019-04-10 LAB — COMPREHENSIVE METABOLIC PANEL
ALT: 37 U/L (ref 0–44)
AST: 36 U/L (ref 15–41)
Albumin: 2.8 g/dL — ABNORMAL LOW (ref 3.5–5.0)
Alkaline Phosphatase: 45 U/L (ref 38–126)
Anion gap: 10 (ref 5–15)
BUN: 52 mg/dL — ABNORMAL HIGH (ref 8–23)
CO2: 26 mmol/L (ref 22–32)
Calcium: 9.5 mg/dL (ref 8.9–10.3)
Chloride: 113 mmol/L — ABNORMAL HIGH (ref 98–111)
Creatinine, Ser: 1.11 mg/dL (ref 0.61–1.24)
GFR calc Af Amer: >60 mL/min (ref >60)
GFR calc non Af Amer: 59 mL/min — ABNORMAL LOW (ref >60)
Glucose, Bld: 163 mg/dL — ABNORMAL HIGH (ref 70–99)
Potassium: 4.3 mmol/L (ref 3.5–5.1)
Sodium: 149 mmol/L — ABNORMAL HIGH (ref 135–145)
Total Bilirubin: 0.8 mg/dL (ref 0.3–1.2)
Total Protein: 5.6 g/dL — ABNORMAL LOW (ref 6.5–8.1)

## 2019-04-10 LAB — D-DIMER, QUANTITATIVE
D-Dimer, Quant: 7.07 ug/mL-FEU — ABNORMAL HIGH (ref 0.00–0.50)
D-Dimer, Quant: 7.64 ug/mL-FEU — ABNORMAL HIGH (ref 0.00–0.50)

## 2019-04-10 LAB — CBC WITH DIFFERENTIAL/PLATELET
Abs Immature Granulocytes: 0.31 10*3/uL — ABNORMAL HIGH (ref 0.00–0.07)
Basophils Absolute: 0.1 10*3/uL (ref 0.0–0.1)
Basophils Relative: 1 %
Eosinophils Absolute: 0 10*3/uL (ref 0.0–0.5)
Eosinophils Relative: 0 %
HCT: 37.7 % — ABNORMAL LOW (ref 39.0–52.0)
Hemoglobin: 12 g/dL — ABNORMAL LOW (ref 13.0–17.0)
Immature Granulocytes: 5 %
Lymphocytes Relative: 4 %
Lymphs Abs: 0.3 10*3/uL — ABNORMAL LOW (ref 0.7–4.0)
MCH: 29.1 pg (ref 26.0–34.0)
MCHC: 31.8 g/dL (ref 30.0–36.0)
MCV: 91.3 fL (ref 80.0–100.0)
Monocytes Absolute: 0.5 10*3/uL (ref 0.1–1.0)
Monocytes Relative: 8 %
Neutro Abs: 5.5 10*3/uL (ref 1.7–7.7)
Neutrophils Relative %: 82 %
Platelets: 233 10*3/uL (ref 150–400)
RBC: 4.13 MIL/uL — ABNORMAL LOW (ref 4.22–5.81)
RDW: 13.9 % (ref 11.5–15.5)
WBC: 6.6 10*3/uL (ref 4.0–10.5)
nRBC: 0.5 % — ABNORMAL HIGH (ref 0.0–0.2)

## 2019-04-10 LAB — FERRITIN: Ferritin: 499 ng/mL — ABNORMAL HIGH (ref 24–336)

## 2019-04-10 LAB — C-REACTIVE PROTEIN: CRP: 2.6 mg/dL — ABNORMAL HIGH (ref <1.0)

## 2019-04-10 MED ORDER — DEXTROSE 5 % IV SOLN: INTRAVENOUS | Status: AC

## 2019-04-10 MED ORDER — ENOXAPARIN SODIUM 40 MG/0.4ML ~~LOC~~ SOLN
40.0000 mg | Freq: Two times a day (BID) | SUBCUTANEOUS | Status: DC
  Administered 2019-04-10 – 2019-04-15 (×11): 40 mg via SUBCUTANEOUS
  Filled 2019-04-10 (×11): qty 0.4

## 2019-04-10 NOTE — Plan of Care (Signed)
  Problem: Coping: Goal: Psychosocial and spiritual needs will be supported Outcome: Progressing   Problem: Respiratory: Goal: Will maintain a patent airway Outcome: Progressing Goal: Complications related to the disease process, condition or treatment will be avoided or minimized Outcome: Progressing   

## 2019-04-10 NOTE — Progress Notes (Signed)
[facility] PROGRESS NOTE    Progress Note  Cody Bradshaw  TDD:[id number] DOB: 01-25-1930 DOA: 04/05/2019 PCP: System, Provider Not In     Brief Narrative:   Cody Bradshaw is an 85 y.o. male past medical history of dementia, who presents as a transfer from [facility] for respiratory failure with hypoxia due to COVID-19.  Patient has been complaining of shortness of breath for several days prior to admission tested positive for COVID-19 2 days prior to admission, EMS was called by facility as she had a temperature 104 and her saturations were 64% on room air placed on oxygen and her saturations improved to over 88%, was started on IV remdesivir and steroids and transferred to [facility] for further management.  Assessment/Plan:   Acute respiratory failure with hypoxia secondary to pneumonia due to COVID-19 virus: She is currently requiring 5 L of high flow nasal cannula to keep saturations greater than 94%. He was started empirically on IV remdesivir and steroids, vitamin C and zinc. He was given Actemra on 04/06/2019 and convalescent plasma 04/06/2019. Laboratory markers are improving except for her D-dimer which is elevated at 7, will increase her Lovenox to subtherapeutic dose. D-dimer this morning is pending.  Acute metabolic encephalopathy, possibly acute confusional state: He has no focal deficits on physical exam. He is currently on Seroquel. We will try to keep the blinds open during the day and closed at night, keep the patient out of bed to chair consult physical therapy.  1 out of 2 blood cultures positive for staph: Likely contaminant. Has remained afebrile with no leukocytosis blood cultures in house have been negative till date.  Hypothermia: Noted hypothermic by EMS has now resolved. Possibly due to viral illness.  Bradycardia: Patient is in no AV nodal blocking agent orally.  He is on timolol eyedrops. 12-lead EKG shows first-degree AV block, with a right  bundle branch block.  Nonspecific T wave changes.  History of dementia: Patient is hard of hearing, continue memantine.  History of glaucoma: Holding atenolol due to bradycardia continue with her eyedrops will need to follow-up with ophthalmology as an outpatient.  Abnormal TSH test: He is currently asymptomatic levels will need to be rechecked in 6 weeks after discharge.  Hypokalemia: Replete orally now resolved.  Normocytic anemia: No signs of overt bleeding.   DVT prophylaxis: lovenox Family Communication:none Disposition Plan/Barrier to D/C: Unable to determine. Code Status:     Code Status Orders  (From admission, onward)         Start     Ordered   04/06/19 0111  Full code  Continuous     04/06/19 0113        Code Status History    This patient has a current code status but no historical code status.   Advance Care Planning Activity        IV Access:    Peripheral IV   Procedures and diagnostic studies:   No results found.   Medical Consultants:    None.  Anti-Infectives:   IV remdesivir  Subjective:    Cody Bradshaw nonverbal this morning.  Objective:    Vitals:   04/09/19 1934 04/10/19 0113 04/10/19 0400 04/10/19 0459  BP: 108/63  119/71   Pulse: 74 (!) 58 (!) 47 63  Resp: (!) 23 14 14 13   Temp: 98 F (36.7 C) 98.2 F (36.8 C) 98 F (36.7 C)   TempSrc: Oral Oral Oral   SpO2: 100% 99% 100% 100%  Weight:  Height:       SpO2: 100 % O2 Flow Rate (L/min): 5 L/min FiO2 (%): (!) 3 %   Intake/Output Summary (Last 24 hours) at 04/10/2019 0852 Last data filed at 04/09/2019 1826 Gross per 24 hour  Intake 277 ml  Output 300 ml  Net -23 ml   Filed Weights   04/06/19 0100  Weight: 68 kg    Exam: General exam: In no acute distress. Respiratory system: Good air movement and diffuse crackles at bases. Cardiovascular system: S1 & S2 heard, RRR. No JVD. Gastrointestinal system: Abdomen is nondistended, soft and  nontender.  Central nervous system: Alert and oriented x1. No focal neurological deficits. Extremities: No pedal edema. Skin: No rashes, lesions or ulcers  Data Reviewed:    Labs: Basic Metabolic Panel: Recent Labs  Lab 04/06/19 0420 04/07/19 0158 04/08/19 0535 04/09/19 0500 04/10/19 0150  NA 137 144 144 145 149*  K 3.4* 4.0 4.1 4.1 4.3  CL 100 105 108 109 113*  CO2 26 26 26 26 26   GLUCOSE 133* 165* 135* 168* 163*  BUN 42* 50* 47* 50* 52*  CREATININE 1.37* 1.32* 1.19 1.19 1.11  CALCIUM 9.0 9.5 9.5 9.6 9.5  MG  --  2.4  --   --   --    GFR Estimated Creatinine Clearance: 45.1 mL/min (by C-G formula based on SCr of 1.11 mg/dL). Liver Function Tests: Recent Labs  Lab 04/06/19 0420 04/07/19 0158 04/08/19 0535 04/09/19 0500 04/10/19 0150  AST 26 29 24  48* 36  ALT 12 14 14  41 37  ALKPHOS 37* 44 45 50 45  BILITOT 0.6 0.7 0.9 0.8 0.8  PROT 6.1* 6.5 6.0* 6.4* 5.6*  ALBUMIN 2.6* 3.0* 2.9* 3.1* 2.8*   No results for input(s): LIPASE, AMYLASE in the last 168 hours. No results for input(s): AMMONIA in the last 168 hours. Coagulation profile No results for input(s): INR, PROTIME in the last 168 hours. COVID-19 Labs  Recent Labs    04/08/19 0535 04/09/19 0500 04/10/19 0150  DDIMER 1.69* 3.04* 7.07*  FERRITIN 573* 502* 499*  CRP 6.3* 4.1* 2.6*    No results found for: SARSCOV2NAA  CBC: Recent Labs  Lab 04/06/19 0420 04/07/19 0158 04/08/19 0535 04/09/19 0500 04/10/19 0150  WBC 4.5 5.0 5.7 5.2 6.6  NEUTROABS 3.8 4.5 5.2 4.6 5.5  HGB 12.6* 13.2 12.5* 12.9* 12.0*  HCT 38.9* 41.0 38.7* 40.8 37.7*  MCV 90.0 90.7 90.2 91.1 91.3  PLT 177 206 210 236 233   Cardiac Enzymes: No results for input(s): CKTOTAL, CKMB, CKMBINDEX, TROPONINI in the last 168 hours. BNP (last 3 results) No results for input(s): PROBNP in the last 8760 hours. CBG: Recent Labs  Lab 04/06/19 0033 04/06/19 0854  GLUCAP 138* 115*   D-Dimer: Recent Labs    04/09/19 0500 04/10/19 0150   DDIMER 3.04* 7.07*   Hgb A1c: No results for input(s): HGBA1C in the last 72 hours. Lipid Profile: No results for input(s): CHOL, HDL, LDLCALC, TRIG, CHOLHDL, LDLDIRECT in the last 72 hours. Thyroid function studies: No results for input(s): TSH, T4TOTAL, T3FREE, THYROIDAB in the last 72 hours.  Invalid input(s): FREET3 Anemia work up: Recent Labs    04/09/19 0500 04/10/19 0150  FERRITIN 502* 499*   Sepsis Labs: Recent Labs  Lab 04/06/19 0420 04/07/19 0158 04/08/19 0535 04/09/19 0500 04/10/19 0150  PROCALCITON 0.16  --   --   --   --   WBC 4.5 5.0 5.7 5.2 6.6   Microbiology Recent Results (  from the past 240 hour(s))  Culture, blood (routine x 2)     Status: None (Preliminary result)   Collection Time: 04/07/19  9:20 AM   Specimen: BLOOD  Result Value Ref Range Status   Specimen Description   Final    BLOOD RIGHT ANTECUBITAL Performed at [facility], [address] [place], [place] [address]    Special Requests   Final    BOTTLES DRAWN AEROBIC ONLY Blood Culture adequate volume Performed at [facility], [address], [place], [place] [address]    Culture   Final    NO GROWTH 2 DAYS Performed at [facility], [address] [place], [place] [address]    Report Status PENDING  Incomplete  Culture, blood (routine x 2)     Status: None (Preliminary result)   Collection Time: 04/07/19  9:25 AM   Specimen: BLOOD  Result Value Ref Range Status   Specimen Description   Final    BLOOD LEFT ANTECUBITAL Performed at [facility], [address] [place], [place] [address]    Special Requests   Final    BOTTLES DRAWN AEROBIC ONLY Blood Culture adequate volume Performed at [facility], [address], [place], [place] [address]    Culture   Final    NO GROWTH 2 DAYS Performed at [facility], [address], [place], [place] [address]    Report Status PENDING  Incomplete       Medications:   . amLODipine  5 mg Oral Daily  . brinzolamide  1 drop Both Eyes TID   And  . brimonidine  1 drop Both Eyes TID  . dexamethasone (DECADRON) injection  6 mg Intravenous Q12H  . docusate sodium  100 mg Oral Daily  . enoxaparin (LOVENOX) injection  40 mg Subcutaneous Q24H  . famotidine  20 mg Oral Daily  . feeding supplement (ENSURE ENLIVE)  237 mL Oral TID BM  . Ipratropium-Albuterol  1 puff Inhalation Q6H  . latanoprost  1 drop Both Eyes QHS  . memantine  10 mg Oral BID  . QUEtiapine  25 mg Oral Daily  . QUEtiapine  75 mg Oral QHS  . vitamin C  500 mg Oral Daily  . zinc sulfate  220 mg Oral Daily   Continuous Infusions:    LOS: 5 days   Marinda Elk  [facility]  04/10/2019, 8:52 AM

## 2019-04-11 LAB — CBC WITH DIFFERENTIAL/PLATELET
Abs Immature Granulocytes: 0.36 10*3/uL — ABNORMAL HIGH (ref 0.00–0.07)
Basophils Absolute: 0.1 10*3/uL (ref 0.0–0.1)
Basophils Relative: 1 %
Eosinophils Absolute: 0 10*3/uL (ref 0.0–0.5)
Eosinophils Relative: 0 %
HCT: 39.8 % (ref 39.0–52.0)
Hemoglobin: 12.6 g/dL — ABNORMAL LOW (ref 13.0–17.0)
Immature Granulocytes: 5 %
Lymphocytes Relative: 5 %
Lymphs Abs: 0.3 10*3/uL — ABNORMAL LOW (ref 0.7–4.0)
MCH: 29 pg (ref 26.0–34.0)
MCHC: 31.7 g/dL (ref 30.0–36.0)
MCV: 91.5 fL (ref 80.0–100.0)
Monocytes Absolute: 0.4 10*3/uL (ref 0.1–1.0)
Monocytes Relative: 5 %
Neutro Abs: 5.7 10*3/uL (ref 1.7–7.7)
Neutrophils Relative %: 84 %
Platelets: 230 10*3/uL (ref 150–400)
RBC: 4.35 MIL/uL (ref 4.22–5.81)
RDW: 13.9 % (ref 11.5–15.5)
WBC: 6.8 10*3/uL (ref 4.0–10.5)
nRBC: 0 % (ref 0.0–0.2)

## 2019-04-11 LAB — COMPREHENSIVE METABOLIC PANEL
ALT: 32 U/L (ref 0–44)
AST: 27 U/L (ref 15–41)
Albumin: 2.8 g/dL — ABNORMAL LOW (ref 3.5–5.0)
Alkaline Phosphatase: 57 U/L (ref 38–126)
Anion gap: 10 (ref 5–15)
BUN: 50 mg/dL — ABNORMAL HIGH (ref 8–23)
CO2: 26 mmol/L (ref 22–32)
Calcium: 9.6 mg/dL (ref 8.9–10.3)
Chloride: 109 mmol/L (ref 98–111)
Creatinine, Ser: 1.14 mg/dL (ref 0.61–1.24)
GFR calc Af Amer: >60 mL/min (ref >60)
GFR calc non Af Amer: 58 mL/min — ABNORMAL LOW (ref >60)
Glucose, Bld: 181 mg/dL — ABNORMAL HIGH (ref 70–99)
Potassium: 4.2 mmol/L (ref 3.5–5.1)
Sodium: 145 mmol/L (ref 135–145)
Total Bilirubin: 0.8 mg/dL (ref 0.3–1.2)
Total Protein: 5.8 g/dL — ABNORMAL LOW (ref 6.5–8.1)

## 2019-04-11 LAB — FERRITIN: Ferritin: 435 ng/mL — ABNORMAL HIGH (ref 24–336)

## 2019-04-11 LAB — GLUCOSE, CAPILLARY: Glucose-Capillary: 192 mg/dL — ABNORMAL HIGH (ref 70–99)

## 2019-04-11 LAB — D-DIMER, QUANTITATIVE: D-Dimer, Quant: 7.08 ug/mL-FEU — ABNORMAL HIGH (ref 0.00–0.50)

## 2019-04-11 LAB — C-REACTIVE PROTEIN: CRP: 1.3 mg/dL — ABNORMAL HIGH (ref <1.0)

## 2019-04-11 NOTE — Progress Notes (Signed)
[facility] PROGRESS NOTE    Progress Note  Cody Bradshaw  LPF:[id number] DOB: 07-05-1929 DOA: 04/05/2019 PCP: System, Provider Not In     Brief Narrative:   Cody Bradshaw is an 85 y.o. male past medical history of dementia, who presents as a transfer from [facility] for respiratory failure with hypoxia due to COVID-19.  Patient has been complaining of shortness of breath for several days prior to admission tested positive for COVID-19 2 days prior to admission, EMS was called by facility as she had a temperature 104 and her saturations were 64% on room air placed on oxygen and her saturations improved to over 88%, was started on IV remdesivir and steroids and transferred to [facility] for further management.  Assessment/Plan:   Acute respiratory failure with hypoxia secondary to pneumonia due to COVID-19 virus: Still requiring 4 L of high flow nasal cannula to keep saturations greater than 94%. Continue IV remdesivir, steroids vitamin C and zinc, he did receive Actemra on 04/14/2019 and convalescent plasma on the same day.  He has completed his course of IV remdesivir.  Continue steroids for total of 10 days. All of his inflammatory markers are except for his D-dimer are improving, his d-dimer is improving.  Acute metabolic encephalopathy, possibly acute confusional state: No focal deficit nonfocal physical exam, likely acute confusional state. Continue Seroquel use. Try to continue conservative measures or eat reorienting him, try to keep the blinds closed at night open when is day  time.  1 out of 2 blood cultures positive for staph: Likely contaminant. Has remained afebrile with no leukocytosis blood cultures in house have been negative till date.  Hypothermia: Noted hypothermic by EMS has now resolved. Possibly due to viral illness.  Bradycardia: Patient is in no AV nodal blocking agent orally.  He is on timolol eyedrops. 12-lead EKG shows first-degree AV block, with a  right bundle branch block.  Nonspecific T wave changes.  History of dementia: Patient is hard of hearing, continue memantine.  History of glaucoma: Holding atenolol due to bradycardia continue with her eyedrops will need to follow-up with ophthalmology as an outpatient.  Abnormal TSH test: He is currently asymptomatic levels will need to be rechecked in 6 weeks after discharge.  Hypokalemia: Replete orally now resolved.  Normocytic anemia: No signs of overt bleeding.   DVT prophylaxis: lovenox Family Communication:none Disposition Plan/Barrier to D/C: Unable to determine. Code Status:     Code Status Orders  (From admission, onward)         Start     Ordered   04/06/19 0111  Full code  Continuous     04/06/19 0113        Code Status History    This patient has a current code status but no historical code status.   Advance Care Planning Activity        IV Access:    Peripheral IV   Procedures and diagnostic studies:   No results found.   Medical Consultants:    None.  Anti-Infectives:   IV remdesivir  Subjective:    Thornton Dales nonverbal this morning.  Objective:    Vitals:   04/11/19 0545 04/11/19 0558 04/11/19 0600 04/11/19 0801  BP:    (!) 156/70  Pulse: (!) 48  (!) 51 60  Resp: 12 13 13  (!) 25  Temp:    97.9 F (36.6 C)  TempSrc:    Axillary  SpO2: 100%  99% 98%  Weight:      Height:  SpO2: 98 % O2 Flow Rate (L/min): 4 L/min FiO2 (%): (!) 3 %   Intake/Output Summary (Last 24 hours) at 04/11/2019 0816 Last data filed at 04/11/2019 0600 Gross per 24 hour  Intake 1235.51 ml  Output 475 ml  Net 760.51 ml   Filed Weights   04/06/19 0100  Weight: 68 kg    Exam: General exam: In no acute distress. Respiratory system: Good air movement and diffuse crackles at bases. Cardiovascular system: S1 & S2 heard, RRR. No JVD. Gastrointestinal system: Abdomen is nondistended, soft and nontender.  Central nervous system:  Alert and oriented x1. No focal neurological deficits. Extremities: No pedal edema. Skin: No rashes, lesions or ulcers  Data Reviewed:    Labs: Basic Metabolic Panel: Recent Labs  Lab 04/07/19 0158 04/08/19 0535 04/09/19 0500 04/10/19 0150 04/11/19 0340  NA 144 144 145 149* 145  K 4.0 4.1 4.1 4.3 4.2  CL 105 108 109 113* 109  CO2 26 26 26 26 26   GLUCOSE 165* 135* 168* 163* 181*  BUN 50* 47* 50* 52* 50*  CREATININE 1.32* 1.19 1.19 1.11 1.14  CALCIUM 9.5 9.5 9.6 9.5 9.6  MG 2.4  --   --   --   --    GFR Estimated Creatinine Clearance: 43.9 mL/min (by C-G formula based on SCr of 1.14 mg/dL). Liver Function Tests: Recent Labs  Lab 04/07/19 0158 04/08/19 0535 04/09/19 0500 04/10/19 0150 04/11/19 0340  AST 29 24 48* 36 27  ALT 14 14 41 37 32  ALKPHOS 44 45 50 45 57  BILITOT 0.7 0.9 0.8 0.8 0.8  PROT 6.5 6.0* 6.4* 5.6* 5.8*  ALBUMIN 3.0* 2.9* 3.1* 2.8* 2.8*   No results for input(s): LIPASE, AMYLASE in the last 168 hours. No results for input(s): AMMONIA in the last 168 hours. Coagulation profile No results for input(s): INR, PROTIME in the last 168 hours. COVID-19 Labs  Recent Labs    04/09/19 0500 04/10/19 0150 04/10/19 1532 04/11/19 0340  DDIMER 3.04* 7.07* 7.64*  --   FERRITIN 502* 499*  --  435*  CRP 4.1* 2.6*  --  1.3*    No results found for: SARSCOV2NAA  CBC: Recent Labs  Lab 04/07/19 0158 04/08/19 0535 04/09/19 0500 04/10/19 0150 04/11/19 0340  WBC 5.0 5.7 5.2 6.6 6.8  NEUTROABS 4.5 5.2 4.6 5.5 5.7  HGB 13.2 12.5* 12.9* 12.0* 12.6*  HCT 41.0 38.7* 40.8 37.7* 39.8  MCV 90.7 90.2 91.1 91.3 91.5  PLT 206 210 236 233 230   Cardiac Enzymes: No results for input(s): CKTOTAL, CKMB, CKMBINDEX, TROPONINI in the last 168 hours. BNP (last 3 results) No results for input(s): PROBNP in the last 8760 hours. CBG: Recent Labs  Lab 04/06/19 0033 04/06/19 0854  GLUCAP 138* 115*   D-Dimer: Recent Labs    04/10/19 0150 04/10/19 1532  DDIMER  7.07* 7.64*   Hgb A1c: No results for input(s): HGBA1C in the last 72 hours. Lipid Profile: No results for input(s): CHOL, HDL, LDLCALC, TRIG, CHOLHDL, LDLDIRECT in the last 72 hours. Thyroid function studies: No results for input(s): TSH, T4TOTAL, T3FREE, THYROIDAB in the last 72 hours.  Invalid input(s): FREET3 Anemia work up: Recent Labs    04/10/19 0150 04/11/19 0340  FERRITIN 499* 435*   Sepsis Labs: Recent Labs  Lab 04/06/19 0420 04/08/19 0535 04/09/19 0500 04/10/19 0150 04/11/19 0340  PROCALCITON 0.16  --   --   --   --   WBC 4.5 5.7 5.2 6.6 6.8  Microbiology Recent Results (from the past 240 hour(s))  Culture, blood (routine x 2)     Status: None (Preliminary result)   Collection Time: 04/07/19  9:20 AM   Specimen: BLOOD  Result Value Ref Range Status   Specimen Description   Final    BLOOD RIGHT ANTECUBITAL Performed at [facility] [address], [place], [place] [address]    Special Requests   Final    BOTTLES DRAWN AEROBIC ONLY Blood Culture adequate volume Performed at [facility] [address] [place], [place] [address]    Culture   Final    NO GROWTH 3 DAYS Performed at [facility], [place] [address], [place], [place] [address]    Report Status PENDING  Incomplete  Culture, blood (routine x 2)     Status: None (Preliminary result)   Collection Time: 04/07/19  9:25 AM   Specimen: BLOOD  Result Value Ref Range Status   Specimen Description   Final    BLOOD LEFT ANTECUBITAL Performed at [facility] [address], [place], [place] [address]    Special Requests   Final    BOTTLES DRAWN AEROBIC ONLY Blood Culture adequate volume Performed at [facility] [address] [place], [place] [address]    Culture   Final    NO GROWTH 3 DAYS Performed at [facility], [place] [address], [place], [place] [address]    Report Status PENDING  Incomplete      Medications:   . amLODipine  5 mg Oral Daily  . brinzolamide  1 drop Both Eyes TID   And  . brimonidine  1 drop Both Eyes TID  . dexamethasone (DECADRON) injection  6 mg Intravenous Q12H  . docusate sodium  100 mg Oral Daily  . enoxaparin (LOVENOX) injection  40 mg Subcutaneous Q12H  . famotidine  20 mg Oral Daily  . feeding supplement (ENSURE ENLIVE)  237 mL Oral TID BM  . Ipratropium-Albuterol  1 puff Inhalation Q6H  . latanoprost  1 drop Both Eyes QHS  . memantine  10 mg Oral BID  . QUEtiapine  25 mg Oral Daily  . QUEtiapine  75 mg Oral QHS  . vitamin C  500 mg Oral Daily  . zinc sulfate  220 mg Oral Daily   Continuous Infusions: . dextrose 75 mL/hr at 04/11/19 0308      LOS: 6 days   Charlynne Cousins  [facility]  04/11/2019, 8:16 AM

## 2019-04-11 NOTE — Progress Notes (Signed)
Physical Therapy Treatment Patient Details Name: Cody Bradshaw MRN: [id number] DOB: 1929/09/26 Today's Date: 04/11/2019    History of Present Illness Pt is an 85 y.o. male with recent ED visit for upper respiratory symptoms with (+) COVID test (12/9) and d/c home, now admitted 04/05/19 with AMS and hypoxia. CXR showed bibasilar infiltrates. PMH includes dementia, HOH.   PT Comments    Pt with increased lethargy this session. Requiring modA for seated and standing activity, eyes closed majority of session but following some visual instructions when cued; HR up to 140s with standing exercise. Based on current functional status, feel pt would benefit from SNF-level therapies to maximize functional mobility and independence prior to return home, as well as decrease caregiver burden. D/c recommendations updated accordingly. Will continue to follow acutely.    Follow Up Recommendations  SNF;Supervision/Assistance - 24 hour     Equipment Recommendations  Rolling walker with 5" wheels;3in1 (PT)    Recommendations for Other Services       Precautions / Restrictions Precautions Precautions: Fall;Other (comment) Precaution Comments: Baseline dementia, VERY HOH; safety sitter Restrictions Weight Bearing Restrictions: No    Mobility  Bed Mobility               General bed mobility comments: Received sitting in recliner  Transfers Overall transfer level: Needs assistance Equipment used: 1 person hand held assist Transfers: Sit to/from Stand Sit to Stand: Mod assist         General transfer comment: ModA for HHA to elevate trunk and maintain balance; pt performed repeated sit<>stand with visual cues, lethargic; HR up to 140s, SpO2 >/90% on 4L O2  Ambulation/Gait             General Gait Details: Deferred secondary to lethargy and tachycardia with exertion   Stairs             Wheelchair Mobility    Modified Rankin (Stroke Patients Only)       Balance  Overall balance assessment: Needs assistance Sitting-balance support: No upper extremity supported;Feet supported Sitting balance-Leahy Scale: Fair       Standing balance-Leahy Scale: Poor Standing balance comment: Reliant on UE support and external assist                            Cognition Arousal/Alertness: Lethargic Behavior During Therapy: Flat affect Overall Cognitive Status: History of cognitive impairments - at baseline                                 General Comments: Baseline dementia and communication impacted by very HOH. Pt following basic visual/gestural commands ~50% of session. Restlessness has now become lethargy, pt keeping eyes closed majority of session except when cued. Clearly very fatigued      Exercises Other Exercises Other Exercises: Initial passive LAQ and hip flexion, then pt performing himself when tapped for cue; good motion and >3/5 strength    General Comments        Pertinent Vitals/Pain Pain Assessment: Faces Faces Pain Scale: Hurts a little bit Pain Location: Generalized Pain Descriptors / Indicators: Tiring Pain Intervention(s): Monitored during session;Limited activity within patient's tolerance    Home Living                      Prior Function            PT Goals (current  goals can now be found in the care plan section) Progress towards PT goals: Not progressing toward goals - comment(limited by lethargy)    Frequency    Min 2X/week      PT Plan Discharge plan needs to be updated;Frequency needs to be updated    Co-evaluation              AM-PAC PT "6 Clicks" Mobility   Outcome Measure  Help needed turning from your back to your side while in a flat bed without using bedrails?: A Little Help needed moving from lying on your back to sitting on the side of a flat bed without using bedrails?: A Little Help needed moving to and from a bed to a chair (including a wheelchair)?: A  Lot Help needed standing up from a chair using your arms (e.g., wheelchair or bedside chair)?: A Lot Help needed to walk in hospital room?: A Lot Help needed climbing 3-5 steps with a railing? : A Lot 6 Click Score: 14    End of Session Equipment Utilized During Treatment: Oxygen Activity Tolerance: Patient limited by fatigue;Patient limited by lethargy Patient left: in chair;with call bell/phone within reach;with chair alarm set;with nursing/sitter in room Nurse Communication: Mobility status PT Visit Diagnosis: Other abnormalities of gait and mobility (R26.89);Muscle weakness (generalized) (M62.81)     Time: 4098-1191 PT Time Calculation (min) (ACUTE ONLY): 11 min  Charges:  $Therapeutic Exercise: 8-22 mins                    Ina Homes, PT, DPT Acute Rehabilitation Services  Pager [phone] Office [phone]  Malachy Chamber 04/11/2019, 12:06 PM

## 2019-04-12 LAB — CULTURE, BLOOD (ROUTINE X 2)
Culture: NO GROWTH
Culture: NO GROWTH
Special Requests: ADEQUATE
Special Requests: ADEQUATE

## 2019-04-12 LAB — BASIC METABOLIC PANEL
Anion gap: 8 (ref 5–15)
BUN: 48 mg/dL — ABNORMAL HIGH (ref 8–23)
CO2: 27 mmol/L (ref 22–32)
Calcium: 9.7 mg/dL (ref 8.9–10.3)
Chloride: 109 mmol/L (ref 98–111)
Creatinine, Ser: 1.11 mg/dL (ref 0.61–1.24)
GFR calc Af Amer: >60 mL/min (ref >60)
GFR calc non Af Amer: 59 mL/min — ABNORMAL LOW (ref >60)
Glucose, Bld: 190 mg/dL — ABNORMAL HIGH (ref 70–99)
Potassium: 4.1 mmol/L (ref 3.5–5.1)
Sodium: 144 mmol/L (ref 135–145)

## 2019-04-12 LAB — D-DIMER, QUANTITATIVE: D-Dimer, Quant: 6.39 ug/mL-FEU — ABNORMAL HIGH (ref 0.00–0.50)

## 2019-04-12 NOTE — Progress Notes (Signed)
[facility] PROGRESS NOTE    Progress Note  Cody Bradshaw  TKP:[id number] DOB: Jan 07, 1930 DOA: 04/05/2019 PCP: System, Provider Not In     Brief Narrative:   Cody Bradshaw is an 85 y.o. male past medical history of dementia, who presents as a transfer from [facility] for respiratory failure with hypoxia due to COVID-19.  Patient has been complaining of shortness of breath for several days prior to admission tested positive for COVID-19 2 days prior to admission, EMS was called by facility as she had a temperature 104 and her saturations were 64% on room air placed on oxygen and her saturations improved to over 88%, was started on IV remdesivir and steroids and transferred to [facility] for further management.  Assessment/Plan:   Acute respiratory failure with hypoxia secondary to pneumonia due to COVID-19 virus: Patient is around 2-3 L of high flow nasal cannula to keep saturation greater than 92%. Continue IV remdesivir, vitamin C and zinc.  He did receive Actemra and convalescent plasma on 04/14/2019. Continue steroids for 10 days. His D-dimer is slowly improving this morning.  Acute metabolic encephalopathy, possibly acute confusional state: No focal deficit nonfocal physical exam, likely acute confusional state. Continue Seroquel use. Try to continue conservative measures or eat reorienting him, try to keep the blinds closed at night open when is day  time.  1 out of 2 blood cultures positive for staph: Likely contaminant. Has remained afebrile with no leukocytosis blood cultures in house have been negative till date.  Hypothermia: Noted hypothermic by EMS has now resolved. Possibly due to viral illness.  Bradycardia: Patient is in no AV nodal blocking agent orally.  He is on timolol eyedrops. 12-lead EKG shows first-degree AV block, with a right bundle branch block.  Nonspecific T wave changes.  History of dementia: Patient is hard of hearing, continue  memantine.  History of glaucoma: Holding atenolol due to bradycardia continue with her eyedrops will need to follow-up with ophthalmology as an outpatient.  Abnormal TSH test: He is currently asymptomatic levels will need to be rechecked in 6 weeks after discharge.  Hypokalemia: Replete orally now resolved.  Normocytic anemia: No signs of overt bleeding.   DVT prophylaxis: lovenox Family Communication:none Disposition Plan/Barrier to D/C: Unable to determine. Code Status:     Code Status Orders  (From admission, onward)         Start     Ordered   04/06/19 0111  Full code  Continuous     04/06/19 0113        Code Status History    This patient has a current code status but no historical code status.   Advance Care Planning Activity        IV Access:    Peripheral IV   Procedures and diagnostic studies:   No results found.   Medical Consultants:    None.  Anti-Infectives:   IV remdesivir  Subjective:    Cody Bradshaw nonverbal this morning.  Objective:    Vitals:   04/11/19 1648 04/11/19 2046 04/11/19 2300 04/12/19 0313  BP:  (!) 141/92 (!) 145/79 139/85  Pulse: 60 63 62 60  Resp:  (!) 21 20 17   Temp: 97.6 F (36.4 C) 97.6 F (36.4 C) 97.6 F (36.4 C) 97.6 F (36.4 C)  TempSrc: Axillary Axillary Axillary Axillary  SpO2: 100% 92% 92% 100%  Weight:      Height:       SpO2: 100 % O2 Flow Rate (L/min): 3 L/min FiO2 (%): )  3 %   Intake/Output Summary (Last 24 hours) at 04/12/2019 0750 Last data filed at 04/11/2019 1700 Gross per 24 hour  Intake 117 ml  Output 200 ml  Net -83 ml   Filed Weights   04/06/19 0100  Weight: 68 kg    Exam: General exam: In no acute distress. Respiratory system: Good air movement and clear to auscultation. Cardiovascular system: S1 & S2 heard, RRR. No JVD. Gastrointestinal system: Abdomen is nondistended, soft and nontender.  Extremities: No pedal edema. Skin: No rashes, lesions or  ulcers  Data Reviewed:    Labs: Basic Metabolic Panel: Recent Labs  Lab 04/07/19 0158 04/08/19 0535 04/09/19 0500 04/10/19 0150 04/11/19 0340 04/12/19 0056  NA 144 144 145 149* 145 144  K 4.0 4.1 4.1 4.3 4.2 4.1  CL 105 108 109 113* 109 109  CO2 26 26 26 26 26 27   GLUCOSE 165* 135* 168* 163* 181* 190*  BUN 50* 47* 50* 52* 50* 48*  CREATININE 1.32* 1.19 1.19 1.11 1.14 1.11  CALCIUM 9.5 9.5 9.6 9.5 9.6 9.7  MG 2.4  --   --   --   --   --    GFR Estimated Creatinine Clearance: 45.1 mL/min (by C-G formula based on SCr of 1.11 mg/dL). Liver Function Tests: Recent Labs  Lab 04/07/19 0158 04/08/19 0535 04/09/19 0500 04/10/19 0150 04/11/19 0340  AST 29 24 48* 36 27  ALT 14 14 41 37 32  ALKPHOS 44 45 50 45 57  BILITOT 0.7 0.9 0.8 0.8 0.8  PROT 6.5 6.0* 6.4* 5.6* 5.8*  ALBUMIN 3.0* 2.9* 3.1* 2.8* 2.8*   No results for input(s): LIPASE, AMYLASE in the last 168 hours. No results for input(s): AMMONIA in the last 168 hours. Coagulation profile No results for input(s): INR, PROTIME in the last 168 hours. COVID-19 Labs  Recent Labs    04/10/19 0150 04/10/19 1532 04/11/19 0340 04/12/19 0056  DDIMER 7.07* 7.64* 7.08* 6.39*  FERRITIN 499*  --  435*  --   CRP 2.6*  --  1.3*  --     No results found for: SARSCOV2NAA  CBC: Recent Labs  Lab 04/07/19 0158 04/08/19 0535 04/09/19 0500 04/10/19 0150 04/11/19 0340  WBC 5.0 5.7 5.2 6.6 6.8  NEUTROABS 4.5 5.2 4.6 5.5 5.7  HGB 13.2 12.5* 12.9* 12.0* 12.6*  HCT 41.0 38.7* 40.8 37.7* 39.8  MCV 90.7 90.2 91.1 91.3 91.5  PLT 206 210 236 233 230   Cardiac Enzymes: No results for input(s): CKTOTAL, CKMB, CKMBINDEX, TROPONINI in the last 168 hours. BNP (last 3 results) No results for input(s): PROBNP in the last 8760 hours. CBG: Recent Labs  Lab 04/06/19 0033 04/06/19 0854 04/11/19 1228  GLUCAP 138* 115* 192*   D-Dimer: Recent Labs    04/11/19 0340 04/12/19 0056  DDIMER 7.08* 6.39*   Hgb A1c: No results for  input(s): HGBA1C in the last 72 hours. Lipid Profile: No results for input(s): CHOL, HDL, LDLCALC, TRIG, CHOLHDL, LDLDIRECT in the last 72 hours. Thyroid function studies: No results for input(s): TSH, T4TOTAL, T3FREE, THYROIDAB in the last 72 hours.  Invalid input(s): FREET3 Anemia work up: Recent Labs    04/10/19 0150 04/11/19 0340  FERRITIN 499* 435*   Sepsis Labs: Recent Labs  Lab 04/06/19 0420 04/08/19 0535 04/09/19 0500 04/10/19 0150 04/11/19 0340  PROCALCITON 0.16  --   --   --   --   WBC 4.5 5.7 5.2 6.6 6.8   Microbiology Recent Results (from the  past 240 hour(s))  Culture, blood (routine x 2)     Status: None (Preliminary result)   Collection Time: 04/07/19  9:20 AM   Specimen: BLOOD  Result Value Ref Range Status   Specimen Description   Final    BLOOD RIGHT ANTECUBITAL Performed at [facility] [address], [place], [place] [address]    Special Requests   Final    BOTTLES DRAWN AEROBIC ONLY Blood Culture adequate volume Performed at [facility] [address] [place] [address], [place], [place] [address]    Culture   Final    NO GROWTH 4 DAYS Performed at [facility], [place] [address] [place], [place] [address]    Report Status PENDING  Incomplete  Culture, blood (routine x 2)     Status: None (Preliminary result)   Collection Time: 04/07/19  9:25 AM   Specimen: BLOOD  Result Value Ref Range Status   Specimen Description   Final    BLOOD LEFT ANTECUBITAL Performed at [facility] [address] [place], [place] [address]    Special Requests   Final    BOTTLES DRAWN AEROBIC ONLY Blood Culture adequate volume Performed at [facility] [address] [place], [place] [address]    Culture   Final    NO GROWTH 4 DAYS Performed at [facility], [place] [address] [place], [place] [address]    Report Status PENDING  Incomplete     Medications:   . amLODipine  5 mg Oral  Daily  . brinzolamide  1 drop Both Eyes TID   And  . brimonidine  1 drop Both Eyes TID  . dexamethasone (DECADRON) injection  6 mg Intravenous Q12H  . docusate sodium  100 mg Oral Daily  . enoxaparin (LOVENOX) injection  40 mg Subcutaneous Q12H  . famotidine  20 mg Oral Daily  . feeding supplement (ENSURE ENLIVE)  237 mL Oral TID BM  . Ipratropium-Albuterol  1 puff Inhalation Q6H  . latanoprost  1 drop Both Eyes QHS  . memantine  10 mg Oral BID  . QUEtiapine  25 mg Oral Daily  . QUEtiapine  75 mg Oral QHS  . vitamin C  500 mg Oral Daily  . zinc sulfate  220 mg Oral Daily   Continuous Infusions:     LOS: 7 days   Charlynne Cousins  [facility]  04/12/2019, 7:50 AM

## 2019-04-12 NOTE — Plan of Care (Signed)
  Problem: Coping: Goal: Psychosocial and spiritual needs will be supported Outcome: Progressing   Problem: Respiratory: Goal: Will maintain a patent airway Outcome: Progressing Goal: Complications related to the disease process, condition or treatment will be avoided or minimized Outcome: Progressing   

## 2019-04-12 NOTE — Plan of Care (Signed)
  Problem: Coping: Goal: Psychosocial and spiritual needs will be supported Outcome: Progressing   Problem: Respiratory: Goal: Will maintain a patent airway Outcome: Progressing   Problem: Respiratory: Goal: Complications related to the disease process, condition or treatment will be avoided or minimized Outcome: Progressing   

## 2019-04-13 LAB — BASIC METABOLIC PANEL
Anion gap: 10 (ref 5–15)
BUN: 54 mg/dL — ABNORMAL HIGH (ref 8–23)
CO2: 24 mmol/L (ref 22–32)
Calcium: 9.4 mg/dL (ref 8.9–10.3)
Chloride: 110 mmol/L (ref 98–111)
Creatinine, Ser: 1.21 mg/dL (ref 0.61–1.24)
GFR calc Af Amer: >60 mL/min (ref >60)
GFR calc non Af Amer: 54 mL/min — ABNORMAL LOW (ref >60)
Glucose, Bld: 161 mg/dL — ABNORMAL HIGH (ref 70–99)
Potassium: 4.5 mmol/L (ref 3.5–5.1)
Sodium: 144 mmol/L (ref 135–145)

## 2019-04-13 MED ORDER — DEXAMETHASONE 6 MG PO TABS: 6.0000 mg | ORAL_TABLET | Freq: Every day | ORAL | 0 refills | Status: AC

## 2019-04-13 MED ORDER — DEXAMETHASONE 6 MG PO TABS
6.0000 mg | ORAL_TABLET | Freq: Every day | ORAL | Status: AC
  Administered 2019-04-13 – 2019-04-15 (×3): 6 mg via ORAL
  Filled 2019-04-13 (×3): qty 1

## 2019-04-13 NOTE — NC FL2 (Signed)
[place] LEVEL OF CARE SCREENING TOOL     IDENTIFICATION  Patient Name: Cody Bradshaw Birthdate: 10-20-1929 Sex: male Admission Date (Current Location): 04/05/2019  [facility] and [place] Number:  Herbalist and Address:  [facility] [place], [place] [address] [place], [place] [address]([address])      Provider Number: [id number]  Attending Physician Name and Address:  Charlynne Cousins, MD  Relative Name and Phone Number:  Daughter: Marya Fossa  [phone]    Current Level of Care: Hospital Recommended Level of Care: [place] Prior Approval Number:    Date Approved/Denied:   PASRR Number:    Discharge Plan: SNF    Current Diagnoses: Patient Active Problem List   Diagnosis Date Noted  . Pneumonia due to COVID-19 virus 04/10/2019  . Acute respiratory failure with hypoxia ([place]) 04/06/2019  . Bradycardia 04/06/2019  . Hypertension   . GERD (gastroesophageal reflux disease)   . Dementia ([place])     Orientation RESPIRATION BLADDER Height & Weight     Self  Normal Incontinent Weight: 68 kg Height:  5\' 8"  (172.7 cm)  BEHAVIORAL SYMPTOMS/MOOD NEUROLOGICAL BOWEL NUTRITION STATUS      Incontinent Diet  AMBULATORY STATUS COMMUNICATION OF NEEDS Skin   Extensive Assist Verbally Normal                       Personal Care Assistance Level of Assistance  Total care, Dressing, Feeding, Bathing Bathing Assistance: Maximum assistance Feeding assistance: Maximum assistance Dressing Assistance: Maximum assistance Total Care Assistance: Maximum assistance   Functional Limitations Info  Hearing(uses Hearing aids.)   Hearing Info: Impaired      SPECIAL CARE FACTORS FREQUENCY  PT (By licensed PT), OT (By licensed OT)     PT Frequency: 5x/week OT Frequency: min 3x/week            Contractures Contractures Info: Not present    Additional Factors Info  Code Status, Allergies Code Status  Info: Full Allergies Info: Penicillins           Current Medications (04/13/2019):  This is the current hospital active medication list Current Facility-Administered Medications  Medication Dose Route Frequency Provider Last Rate Last Admin  . acetaminophen (TYLENOL) tablet 650 mg  650 mg Oral Q6H PRN Peyton Bottoms, MD      . amLODipine (NORVASC) tablet 5 mg  5 mg Oral Daily Bonnielee Haff, MD   5 mg at 04/13/19 0175  . brinzolamide (AZOPT) 1 % ophthalmic suspension 1 drop  1 drop Both Eyes TID Bonnielee Haff, MD   1 drop at 04/13/19 0955   And  . brimonidine (ALPHAGAN) 0.2 % ophthalmic solution 1 drop  1 drop Both Eyes TID Bonnielee Haff, MD   1 drop at 04/13/19 0955  . chlorpheniramine-HYDROcodone (TUSSIONEX) 10-8 MG/5ML suspension 5 mL  5 mL Oral Q12H PRN Peyton Bottoms, MD   5 mL at 04/12/19 2140  . dexamethasone (DECADRON) tablet 6 mg  6 mg Oral Daily Charlynne Cousins, MD   6 mg at 04/13/19 1025  . docusate sodium (COLACE) capsule 100 mg  100 mg Oral Daily Peyton Bottoms, MD   100 mg at 04/13/19 0953  . enoxaparin (LOVENOX) injection 40 mg  40 mg Subcutaneous Q12H Charlynne Cousins, MD   40 mg at 04/13/19 0951  . famotidine (PEPCID) tablet 20 mg  20 mg Oral Daily Bonnielee Haff, MD   20 mg at 04/13/19  4268  . feeding supplement (ENSURE ENLIVE) (ENSURE ENLIVE) liquid 237 mL  237 mL Oral TID BM Osvaldo Shipper, MD   237 mL at 04/13/19 0954  . guaiFENesin-dextromethorphan (ROBITUSSIN DM) 100-10 MG/5ML syrup 10 mL  10 mL Oral Q4H PRN Coletta Memos, MD   10 mL at 04/09/19 0929  . haloperidol lactate (HALDOL) injection 2 mg  2 mg Intravenous Q6H PRN Osvaldo Shipper, MD   2 mg at 04/09/19 2358  . Ipratropium-Albuterol (COMBIVENT) respimat 1 puff  1 puff Inhalation Q6H Coletta Memos, MD   1 puff at 04/13/19 [id number]  . latanoprost (XALATAN) 0.005 % ophthalmic solution 1 drop  1 drop Both Eyes QHS Osvaldo Shipper, MD   1 drop at 04/12/19 2140  . memantine (NAMENDA) tablet 10 mg  10 mg Oral  BID Osvaldo Shipper, MD   10 mg at 04/13/19 0953  . polyvinyl alcohol (LIQUIFILM TEARS) 1.4 % ophthalmic solution 1 drop  1 drop Both Eyes BID PRN Osvaldo Shipper, MD      . QUEtiapine (SEROQUEL) tablet 25 mg  25 mg Oral Daily Osvaldo Shipper, MD   25 mg at 04/13/19 0954  . QUEtiapine (SEROQUEL) tablet 75 mg  75 mg Oral QHS Osvaldo Shipper, MD   75 mg at 04/12/19 2140  . vitamin C (ASCORBIC ACID) tablet 500 mg  500 mg Oral Daily Coletta Memos, MD   500 mg at 04/13/19 0953  . zinc sulfate capsule 220 mg  220 mg Oral Daily Coletta Memos, MD   220 mg at 04/13/19 6222     Discharge Medications: Please see discharge summary for a list of discharge medications.  Relevant Imaging Results:  Relevant Lab Results:   Additional Information SSN:[id number]  Durenda Guthrie, RN

## 2019-04-13 NOTE — Discharge Summary (Addendum)
Physician Discharge Summary  Cody Bradshaw ZOX:[id number] DOB: [birth year] DOA: 04/05/2019  PCP: System, Provider Not In  Admit date: 04/05/2019 Discharge date: 04/13/2019  Admitted From: Home Disposition:  SNF  Recommendations for Outpatient Follow-up:  1. Follow up with PCP in 1-2 weeks 2. Please obtain BMP/CBC in one week   Home Health:no Equipment/Devices:None  Discharge Condition:Stable CODE STATUS:Full Diet recommendation: Heart Healthy   Brief/Interim Summary: 85 y.o. male past medical history of dementia, who presents as a transfer from [facility] for respiratory failure with hypoxia due to COVID-19.  Patient has been complaining of shortness of breath for several days prior to admission tested positive for COVID-19 2 days prior to admission, EMS was called by facility as she had a temperature 104 and her saturations were 64% on room air placed on oxygen and her saturations improved to over 88%, was started on IV remdesivir and steroids and transferred to [facility] for further management.  Discharge Diagnoses:  Principal Problem:   Acute respiratory failure with hypoxia (HCC) Active Problems:   Hypertension   GERD (gastroesophageal reflux disease)   Dementia (HCC)   Bradycardia   Pneumonia due to COVID-19 virus Acute respiratory failure with hypoxia secondary to pneumonia due to COVID-19: He was started on supplemental oxygen to keep saturations greater than 92%. He was started on IV remdesivir, steroids vitamin C and zinc. He did receive Actemra and convalescent plasma on 04/14/2019. Continue steroids for 10 days to skilled with 3 more days of oral steroids.  Acute metabolic encephalopathy, possibly acute confusional state: He had no focal deficit on physical exam. He was started on Seroquel, and he was provided with conservative measures, reorienting came in getting out of the chair and he remained stable.    102 blood cultures positive for staph: Likely a  contaminant.  Hypothermia: Resolved likely due to infectious etiology.  Bradycardia: Patient is on no AV nodal blocking agents, his timolol eyedrops were held on admission, twelve-lead EKG showed first-degree AV block with a right bundle branch block expected to be changes. His heart rate came right back up he was restarted on his medications.  History of glaucoma: His atenolol was held on admission due to bradycardia all the other medications were continued he will resume Timolol as an outpatient.  Abnormal TSH: We will need to be repeated in 6-week.  Normocytic anemia: No signs of overt bleeding.   Discharge Instructions  Discharge Instructions    Diet - low sodium heart healthy   Complete by: As directed    Increase activity slowly   Complete by: As directed    MyChart COVID-19 home monitoring program   Complete by: Apr 13, 2019    Is the patient willing to use the MyChart Mobile App for home monitoring?: No     Allergies as of 04/13/2019      Reactions   Penicillins Shortness Of Breath      Medication List    TAKE these medications   acetaminophen 500 MG tablet Commonly known as: TYLENOL Take 1,000 mg by mouth every 6 (six) hours as needed for fever.   amLODipine 2.5 MG tablet Commonly known as: NORVASC Take 2.5 mg by mouth daily.   carboxymethylcellulose 0.5 % Soln Commonly known as: REFRESH PLUS Place 1 drop into both eyes 2 (two) times daily as needed (dry eyes).   dexamethasone 6 MG tablet Commonly known as: DECADRON Take 1 tablet (6 mg total) by mouth daily for 2 days. Start taking on: April 14, 2019  latanoprost 0.005 % ophthalmic solution Commonly known as: XALATAN Place 1 drop into both eyes at bedtime.   lisinopril-hydrochlorothiazide 20-12.5 MG tablet Commonly known as: ZESTORETIC Take 1 tablet by mouth daily.   memantine 10 MG tablet Commonly known as: NAMENDA Take 10 mg by mouth 2 (two) times daily.   SIMBRINZA OP Place 1 drop  into both eyes 3 (three) times daily.   timolol 0.5 % ophthalmic gel-forming Commonly known as: TIMOPTIC-XR Place 1 drop into both eyes 2 (two) times daily.       Allergies  Allergen Reactions  . Penicillins Shortness Of Breath    Consultations:  None   Procedures/Studies: DG CHEST PORT 1 VIEW  Result Date: 04/07/2019 CLINICAL DATA:  Pneumonia due to COVID-19 virus. EXAM: PORTABLE CHEST 1 VIEW COMPARISON:  April 05, 2019. FINDINGS: Stable cardiomediastinal silhouette. Stable bibasilar opacities are noted concerning for pneumonia. Atherosclerosis of thoracic aorta is noted. No pneumothorax or pleural effusion is noted. Bony thorax is unremarkable. IMPRESSION: Aortic atherosclerosis. Stable bibasilar opacities are noted concerning for pneumonia. Follow-up radiographs are recommended to ensure resolution. Electronically Signed   By: Lupita Raider M.D.   On: 04/07/2019 08:58    Subjective: No complaints today, but definitely more awake.  Discharge Exam: Vitals:   04/13/19 0815 04/13/19 1200  BP: (!) 153/81 99/68  Pulse:  (!) 59  Resp:  17  Temp: (!) 97.1 F (36.2 C)   SpO2:  97%   Vitals:   04/12/19 2300 04/13/19 0354 04/13/19 0815 04/13/19 1200  BP: 121/90 120/90 (!) 153/81 99/68  Pulse: 66 68  (!) 59  Resp: 12 13  17   Temp: (!) 97 F (36.1 C) 97.6 F (36.4 C) (!) 97.1 F (36.2 C)   TempSrc: Axillary Oral Oral   SpO2: 96% 96%  97%  Weight:      Height:        General: Pt is alert, awake, not in acute distress Cardiovascular: RRR, S1/S2 +, no rubs, no gallops Respiratory: CTA bilaterally, no wheezing, no rhonchi Abdominal: Soft, NT, ND, bowel sounds + Extremities: no edema, no cyanosis    The results of significant diagnostics from this hospitalization (including imaging, microbiology, ancillary and laboratory) are listed below for reference.     Microbiology: Recent Results (from the past 240 hour(s))  Culture, blood (routine x 2)     Status: None    Collection Time: 04/07/19  9:20 AM   Specimen: BLOOD  Result Value Ref Range Status   Specimen Description   Final    BLOOD RIGHT ANTECUBITAL Performed at [facility], [address], [place], [place]    Special Requests   Final    BOTTLES DRAWN AEROBIC ONLY Blood Culture adequate volume Performed at [facility], [address], [place], [place]    Culture   Final    NO GROWTH 5 DAYS Performed at [facility], [address], [place], [place]    Report Status 04/12/2019 FINAL  Final  Culture, blood (routine x 2)     Status: None   Collection Time: 04/07/19  9:25 AM   Specimen: BLOOD  Result Value Ref Range Status   Specimen Description   Final    BLOOD LEFT ANTECUBITAL Performed at [facility], [address], [place], [place]    Special Requests   Final    BOTTLES DRAWN AEROBIC ONLY Blood Culture adequate volume Performed at [facility], [place] [address], [place], [place] [address]    Culture   Final    NO GROWTH 5 DAYS Performed at [facility], [place] [address], [place], [place] [address]    Report Status 04/12/2019 FINAL  Final     Labs: BNP (last 3 results) Recent Labs    04/06/19 0420  BNP 350.0*   Basic Metabolic Panel: Recent Labs  Lab 04/07/19 0158 04/09/19 0500 04/10/19 0150 04/11/19 0340 04/12/19 0056 04/13/19 0128  NA 144 145 149* 145 144 144  K 4.0 4.1 4.3 4.2 4.1 4.5  CL 105 109 113* 109 109 110  CO2 26 26 26 26 27 24   GLUCOSE 165* 168* 163* 181* 190* 161*  BUN 50* 50* 52* 50* 48* 54*  CREATININE 1.32* 1.19 1.11 1.14 1.11 1.21  CALCIUM 9.5 9.6 9.5 9.6 9.7 9.4  MG 2.4  --   --   --   --   --    Liver Function Tests: Recent Labs  Lab 04/07/19 0158 04/08/19 0535 04/09/19 0500 04/10/19 0150 04/11/19 0340  AST 29 24 48* 36 27  ALT 14 14 41 37 32  ALKPHOS 44 45 50 45 57  BILITOT 0.7 0.9 0.8 0.8 0.8  PROT 6.5  6.0* 6.4* 5.6* 5.8*  ALBUMIN 3.0* 2.9* 3.1* 2.8* 2.8*   No results for input(s): LIPASE, AMYLASE in the last 168 hours. No results for input(s): AMMONIA in the last 168 hours. CBC: Recent Labs  Lab 04/07/19 0158 04/08/19 0535 04/09/19 0500 04/10/19 0150 04/11/19 0340  WBC 5.0 5.7 5.2 6.6 6.8  NEUTROABS 4.5 5.2 4.6 5.5 5.7  HGB 13.2 12.5* 12.9* 12.0* 12.6*  HCT 41.0 38.7* 40.8 37.7* 39.8  MCV 90.7 90.2 91.1 91.3 91.5  PLT 206 210 236 233 230   Cardiac Enzymes: No results for input(s): CKTOTAL, CKMB, CKMBINDEX, TROPONINI in the last 168 hours. BNP: Invalid input(s): POCBNP CBG: Recent Labs  Lab 04/11/19 1228  GLUCAP 192*   D-Dimer Recent Labs    04/11/19 0340 04/12/19 0056  DDIMER 7.08* 6.39*   Hgb A1c No results for input(s): HGBA1C in the last 72 hours. Lipid Profile No results for input(s): CHOL, HDL, LDLCALC, TRIG, CHOLHDL, LDLDIRECT in the last 72 hours. Thyroid function studies No results for input(s): TSH, T4TOTAL, T3FREE, THYROIDAB in the last 72 hours.  Invalid input(s): FREET3 Anemia work up Recent Labs    04/11/19 0340  FERRITIN 435*   Urinalysis No results found for: COLORURINE, APPEARANCEUR, [place], [place], [place], [place], [place], [place], [place], UROBILINOGEN, NITRITE, LEUKOCYTESUR Sepsis Labs Invalid input(s): PROCALCITONIN,  WBC,  LACTICIDVEN Microbiology Recent Results (from the past 240 hour(s))  Culture, blood (routine x 2)     Status: None   Collection Time: 04/07/19  9:20 AM   Specimen: BLOOD  Result Value Ref Range Status   Specimen Description   Final    BLOOD RIGHT ANTECUBITAL Performed at [facility] [address] [place], [place] [address]    Special Requests   Final    BOTTLES DRAWN AEROBIC ONLY Blood Culture adequate volume Performed at [facility] [address] [place], [place] [address]    Culture   Final    NO GROWTH 5 DAYS Performed at [place], [place] [address] [place], [place] [address]    Report Status 04/12/2019 FINAL  Final  Culture, blood (routine x 2)     Status: None   Collection Time: 04/07/19  9:25  AM   Specimen: BLOOD  Result Value Ref Range Status   Specimen Description   Final    BLOOD LEFT ANTECUBITAL Performed at [facility], [address] [place], [place] [address]    Special Requests   Final    BOTTLES DRAWN AEROBIC ONLY Blood Culture adequate volume Performed at [facility], [address] [place], [place] [address]    Culture   Final    NO GROWTH 5 DAYS Performed at [facility], [address] [place], [place] [address]    Report Status 04/12/2019 FINAL  Final     Time coordinating discharge: Over 30 minutes  SIGNED:   Marinda ElkAbraham Feliz Ortiz, MD  [facility] 04/13/2019, 4:13 PM Pager   If 7PM-7AM, please contact night-coverage [email or web address] Password [email or web address]

## 2019-04-13 NOTE — Progress Notes (Signed)
Attempted patient ambulation at this time. Patient was able to tolerate 1 minute of acitivity when out of bed. Patient able to tolerated standing and pivoting but became dyspneic and scared about losing balance. SpO2 while being out of bed dropped to 88% on 3L [place]. Placed back into bed without issue. Bed alarm armed at this time.

## 2019-04-13 NOTE — TOC Progression Note (Signed)
Transition of Care [facility]) - Progression Note    Patient Details  Name: Cody Bradshaw MRN: [id number] Date of Birth: [birth year]  Transition [facility] CM/SW Contact  Loletha Grayer Beverely Pace, RN Phone Number: [phone] (working remotely) 04/13/2019, 2:34 PM  Clinical Narrative:   Patient's daughter was contacted by previous case manager concerning patient's placement. She initially requested a bed at [facility], they have no open beds at this time. Case Manager contacted patient's daughter-Bernadette and explained that we have a bed at [facility] that will be available on Monday. She is in agreement and will explain this to her mom. . Case manager has started [place] request with [facility]. LEX#[id number]. PASRR number has been requested, they need more info, CM has sent. Lingle Must CB#[id number].      Expected Discharge Plan: [place] Barriers to Discharge: Continued Medical Work up  Expected Discharge Plan and Services     Post Acute Care Choice: [place] arrangements for the past 2 months: Single Family Home Expected Discharge Date: 04/13/19                                     Social Determinants of Health (SDOH) Interventions    Readmission Risk Interventions No flowsheet data found.

## 2019-04-14 MED ORDER — ORAL CARE MOUTH RINSE
15.0000 mL | Freq: Two times a day (BID) | OROMUCOSAL | Status: DC
  Administered 2019-04-14 – 2019-04-15 (×3): 15 mL via OROMUCOSAL

## 2019-04-14 NOTE — Discharge Instructions (Signed)

## 2019-04-14 NOTE — Progress Notes (Signed)
Called B[place]adette (patient's daughter) to update her on her dad.

## 2019-04-14 NOTE — Progress Notes (Signed)
[facility] PROGRESS NOTE    Progress Note  Cody DalesJoseph Bradshaw  GEX:[id number] DOB: 1929/10/17 DOA: 04/05/2019 PCP: System, Provider Not In     Brief Narrative:   Cody DalesJoseph Bradshaw is an 85 y.o. male past medical history of dementia, who presents as a transfer from [facility] for respiratory failure with hypoxia due to COVID-19.  Patient has been complaining of shortness of breath for several days prior to admission tested positive for COVID-19 2 days prior to admission, EMS was called by facility as she had a temperature 104 and her saturations were 64% on room air placed on oxygen and her saturations improved to over 88%, was started on IV remdesivir and steroids and [facility].  Assessment/Plan:   Acute respiratory failure with hypoxia secondary to pneumonia due to COVID-19 virus: Patient is stable for discharge and will we were not able to place him to skilled nursing facility on 04/13/2019. Nothing is change overnight.  Acute metabolic encephalopathy, possibly acute confusional state: No focal deficit nonfocal physical exam, likely acute confusional state. Continue Seroquel use. Try to continue conservative measures or eat reorienting him, try to keep the blinds closed at night open when is day  time.  1 out of 2 blood cultures positive for staph: Likely contaminant. Has remained afebrile with no leukocytosis blood cultures in house have been negative till date.  Hypothermia: Noted hypothermic by EMS has now resolved. Possibly due to viral illness.  Bradycardia: Patient is in no AV nodal blocking agent orally.  He is on timolol eyedrops. 12-lead EKG shows first-degree AV block, with a right bundle branch block.  Nonspecific T wave changes.  History of dementia: Patient is hard of hearing, continue memantine.  History of glaucoma: Holding atenolol due to bradycardia continue with her eyedrops will need to follow-up with ophthalmology as  an outpatient.  Abnormal TSH test: He is currently asymptomatic levels will need to be rechecked in 6 weeks after discharge.  Hypokalemia: Replete orally now resolved.  Normocytic anemia: No signs of overt bleeding.   DVT prophylaxis: lovenox Family Communication:none Disposition Plan/Barrier to D/C: Unable to determine. Code Status:     Code Status Orders  (From admission, onward)         Start     Ordered   04/06/19 0111  Full code  Continuous     04/06/19 0113        Code Status History    This patient has a current code status but no historical code status.   Advance Care Planning Activity        IV Access:    Peripheral IV   Procedures and diagnostic studies:   No results found.   Medical Consultants:    None.  Anti-Infectives:   IV remdesivir  Subjective:    Cody Bradshaw nonverbal this morning.  Objective:    Vitals:   04/14/19 0000 04/14/19 0400 04/14/19 0415 04/14/19 0731  BP: 106/70 121/70  122/82  Pulse: 87 (!) 55 75 74  Resp: 15 15 15 17   Temp:  (!) 97.4 F (36.3 C)  (!) 97.5 F (36.4 C)  TempSrc:  Axillary  Axillary  SpO2: 95% (!) 88% 91% 93%  Weight:      Height:       SpO2: 93 % O2 Flow Rate (L/min): 5 L/min FiO2 (%): (!) 3 %   Intake/Output Summary (Last 24 hours) at 04/14/2019 0811 Last data filed at 04/13/2019 1600 Gross per 24 hour  Intake  300 ml  Output 250 ml  Net 50 ml   Filed Weights   04/06/19 0100  Weight: 68 kg    Exam: General exam: In no acute distress. Respiratory system: Good air movement and clear to auscultation. Cardiovascular system: S1 & S2 heard, RRR. No JVD. Gastrointestinal system: Abdomen is nondistended, soft and nontender.  Extremities: No pedal edema. Skin: No rashes, lesions or ulcers  Data Reviewed:    Labs: Basic Metabolic Panel: Recent Labs  Lab 04/09/19 0500 04/10/19 0150 04/11/19 0340 04/12/19 0056 04/13/19 0128  NA 145 149* 145 144 144  K 4.1 4.3 4.2 4.1  4.5  CL 109 113* 109 109 110  CO2 26 26 26 27 24   GLUCOSE 168* 163* 181* 190* 161*  BUN 50* 52* 50* 48* 54*  CREATININE 1.19 1.11 1.14 1.11 1.21  CALCIUM 9.6 9.5 9.6 9.7 9.4   GFR Estimated Creatinine Clearance: 41.4 mL/min (by C-G formula based on SCr of 1.21 mg/dL). Liver Function Tests: Recent Labs  Lab 04/08/19 0535 04/09/19 0500 04/10/19 0150 04/11/19 0340  AST 24 48* 36 27  ALT 14 41 37 32  ALKPHOS 45 50 45 57  BILITOT 0.9 0.8 0.8 0.8  PROT 6.0* 6.4* 5.6* 5.8*  ALBUMIN 2.9* 3.1* 2.8* 2.8*   No results for input(s): LIPASE, AMYLASE in the last 168 hours. No results for input(s): AMMONIA in the last 168 hours. Coagulation profile No results for input(s): INR, PROTIME in the last 168 hours. COVID-19 Labs  Recent Labs    04/12/19 0056  DDIMER 6.39*    No results found for: SARSCOV2NAA  CBC: Recent Labs  Lab 04/08/19 0535 04/09/19 0500 04/10/19 0150 04/11/19 0340  WBC 5.7 5.2 6.6 6.8  NEUTROABS 5.2 4.6 5.5 5.7  HGB 12.5* 12.9* 12.0* 12.6*  HCT 38.7* 40.8 37.7* 39.8  MCV 90.2 91.1 91.3 91.5  PLT 210 236 233 230   Cardiac Enzymes: No results for input(s): CKTOTAL, CKMB, CKMBINDEX, TROPONINI in the last 168 hours. BNP (last 3 results) No results for input(s): PROBNP in the last 8760 hours. CBG: Recent Labs  Lab 04/11/19 1228  GLUCAP 192*   D-Dimer: Recent Labs    04/12/19 0056  DDIMER 6.39*   Hgb A1c: No results for input(s): HGBA1C in the last 72 hours. Lipid Profile: No results for input(s): CHOL, HDL, LDLCALC, TRIG, CHOLHDL, LDLDIRECT in the last 72 hours. Thyroid function studies: No results for input(s): TSH, T4TOTAL, T3FREE, THYROIDAB in the last 72 hours.  Invalid input(s): FREET3 Anemia work up: No results for input(s): VITAMINB12, FOLATE, FERRITIN, TIBC, IRON, RETICCTPCT in the last 72 hours. Sepsis Labs: Recent Labs  Lab 04/08/19 0535 04/09/19 0500 04/10/19 0150 04/11/19 0340  WBC 5.7 5.2 6.6 6.8   Microbiology Recent  Results (from the past 240 hour(s))  Culture, blood (routine x 2)     Status: None   Collection Time: 04/07/19  9:20 AM   Specimen: BLOOD  Result Value Ref Range Status   Specimen Description   Final    BLOOD RIGHT ANTECUBITAL Performed at [facility] [address], [place], [place] [address]    Special Requests   Final    BOTTLES DRAWN AEROBIC ONLY Blood Culture adequate volume Performed at [facility] [address], [place], [place] [address]    Culture   Final    NO GROWTH 5 DAYS Performed at [facility], [place] [address], [place], [place] [address]    Report Status 04/12/2019  FINAL  Final  Culture, blood (routine x 2)     Status: None   Collection Time: 04/07/19  9:25 AM   Specimen: BLOOD  Result Value Ref Range Status   Specimen Description   Final    BLOOD LEFT ANTECUBITAL Performed at [facility], [address], [place], [place] [address]    Special Requests   Final    BOTTLES DRAWN AEROBIC ONLY Blood Culture adequate volume Performed at [facility], [address] [place], [place] [address]    Culture   Final    NO GROWTH 5 DAYS Performed at [facility], [address], [place], [place] [address]    Report Status 04/12/2019 FINAL  Final     Medications:   . amLODipine  5 mg Oral Daily  . brinzolamide  1 drop Both Eyes TID   And  . brimonidine  1 drop Both Eyes TID  . dexamethasone  6 mg Oral Daily  . docusate sodium  100 mg Oral Daily  . enoxaparin (LOVENOX) injection  40 mg Subcutaneous Q12H  . famotidine  20 mg Oral Daily  . feeding supplement (ENSURE ENLIVE)  237 mL Oral TID BM  . Ipratropium-Albuterol  1 puff Inhalation Q6H  . latanoprost  1 drop Both Eyes QHS  . memantine  10 mg Oral BID  . QUEtiapine  25 mg Oral Daily  . QUEtiapine  75 mg Oral QHS  . vitamin C  500 mg Oral Daily  . zinc sulfate  220 mg Oral Daily   Continuous Infusions:     LOS:  9 days   Marinda Elk  [facility]  04/14/2019, 8:11 AM

## 2019-04-15 ENCOUNTER — Other Ambulatory Visit: Payer: Self-pay

## 2019-04-15 ENCOUNTER — Emergency Department (HOSPITAL_COMMUNITY)
Admission: EM | Admit: 2019-04-15 | Discharge: 2019-04-16 | Disposition: A | Discharge status: Home / Self Care | Payer: Medicare Other | Source: Home / Self Care | Attending: Emergency Medicine | Admitting: Emergency Medicine

## 2019-04-15 ENCOUNTER — Emergency Department (HOSPITAL_COMMUNITY): Payer: Medicare Other

## 2019-04-15 DIAGNOSIS — I252 Old myocardial infarction: Secondary | ICD-10-CM | POA: Insufficient documentation

## 2019-04-15 DIAGNOSIS — F0390 Unspecified dementia without behavioral disturbance: Secondary | ICD-10-CM | POA: Insufficient documentation

## 2019-04-15 DIAGNOSIS — U071 COVID-19: Secondary | ICD-10-CM

## 2019-04-15 DIAGNOSIS — Z79899 Other long term (current) drug therapy: Secondary | ICD-10-CM | POA: Insufficient documentation

## 2019-04-15 DIAGNOSIS — I10 Essential (primary) hypertension: Secondary | ICD-10-CM | POA: Insufficient documentation

## 2019-04-15 DIAGNOSIS — I4891 Unspecified atrial fibrillation: Secondary | ICD-10-CM | POA: Diagnosis not present

## 2019-04-15 LAB — GLUCOSE, CAPILLARY: Glucose-Capillary: 101 mg/dL — ABNORMAL HIGH (ref 70–99)

## 2019-04-15 NOTE — ED Notes (Signed)
Nurse Navigator helped pt speak with wife.  Pt appears to be very confused, pulling on wires and posey mittens.  Repositioned pt for comfort

## 2019-04-15 NOTE — ED Notes (Signed)
[phone] shelby wife when you can  

## 2019-04-15 NOTE — ED Notes (Signed)
[facility] called to transport patient  

## 2019-04-15 NOTE — ED Provider Notes (Signed)
[facility] EMERGENCY DEPARTMENT Provider Note   CSN: [id number] Arrival date & time: 04/15/19  1819     History No chief complaint on file.   Cody Bradshaw is a 85 y.o. male.  HPI Cody Bradshaw is a 85 y.o. male with a medical history of dementia, hypertension who presents to the ED from nursing facility.  He was discharged from [facility] today for Covid admission, on 4 L of nasal cannula.  Presented to the nursing facility and they stated that his O2 sats were in the 70s on 4 L nasal cannula.  He denies any other issues or concerns.  He presents on 4 L nasal cannula satting 99%. He is unable to provide history.      Past Medical History:  Diagnosis Date  . Dementia (HCC)   . GERD (gastroesophageal reflux disease)   . Hypertension   . MI (myocardial infarction) [facility])     Patient Active Problem List   Diagnosis Date Noted  . Pneumonia due to COVID-19 virus 04/10/2019  . Acute respiratory failure with hypoxia (HCC) 04/06/2019  . Bradycardia 04/06/2019  . Hypertension   . GERD (gastroesophageal reflux disease)   . Dementia (HCC)     No past surgical history on file.     No family history on file.  Social History   Tobacco Use  . Smoking status: Never Smoker  . Smokeless tobacco: Never Used  Substance Use Topics  . Alcohol use: Not Currently  . Drug use: Not Currently    Home Medications Prior to Admission medications   Medication Sig Start Date End Date Taking? Authorizing Provider  acetaminophen (TYLENOL) 500 MG tablet Take 1,000 mg by mouth every 6 (six) hours as needed for fever.    [provider]  amLODipine (NORVASC) 2.5 MG tablet Take 2.5 mg by mouth daily.    [provider]  Brinzolamide-Brimonidine [facility] OP) Place 1 drop into both eyes 3 (three) times daily.    [provider]  carboxymethylcellulose (REFRESH PLUS) 0.5 % SOLN Place 1 drop into both eyes 2 (two) times daily as needed (dry eyes).     [provider]  dexamethasone (DECADRON) 6 MG tablet Take 1 tablet (6 mg total) by mouth daily for 2 days. 04/14/19 04/11/2019  Marinda Elk, MD  latanoprost (XALATAN) 0.005 % ophthalmic solution Place 1 drop into both eyes at bedtime.    [provider]  lisinopril-hydrochlorothiazide (ZESTORETIC) 20-12.5 MG tablet Take 1 tablet by mouth daily.    [provider]  memantine (NAMENDA) 10 MG tablet Take 10 mg by mouth 2 (two) times daily.    [provider]  timolol (TIMOPTIC-XR) 0.5 % ophthalmic gel-forming Place 1 drop into both eyes 2 (two) times daily.    [provider]    Allergies    Penicillins  Review of Systems   Review of Systems  Unable to perform ROS: Dementia    Physical Exam Updated Vital Signs BP 120/64   Pulse (!) 48   Temp 99 F (37.2 C) (Oral)   Resp (!) 23   SpO2 95%   Physical Exam Vitals and nursing note reviewed.  Constitutional:      General: He is not in acute distress.    Appearance: Normal appearance. He is well-developed. He is ill-appearing. He is not toxic-appearing.     Comments: Chronically ill-appearing male, nonverbal, awake and moving all extremities, no no acute distress  HENT:     Head: Normocephalic  and atraumatic.     Right Ear: External ear normal.     Left Ear: External ear normal.     Nose: Nose normal. No congestion.     Mouth/Throat:     Mouth: Mucous membranes are moist.  Eyes:     General:        Right eye: No discharge.        Left eye: No discharge.     Conjunctiva/sclera: Conjunctivae normal.  Cardiovascular:     Rate and Rhythm: Normal rate and regular rhythm.     Pulses: Normal pulses.     Heart sounds: Normal heart sounds. No murmur.  Pulmonary:     Effort: Pulmonary effort is normal. No respiratory distress.     Breath sounds: No wheezing or rales.     Comments: Coarse lung sounds bilaterally Abdominal:     General: Abdomen is flat. There is no distension.      Palpations: Abdomen is soft.     Tenderness: There is no abdominal tenderness.  Musculoskeletal:        General: No signs of injury. Normal range of motion.     Cervical back: Normal range of motion and neck supple.  Skin:    General: Skin is warm and dry.     Capillary Refill: Capillary refill takes less than 2 seconds.  Neurological:     General: No focal deficit present.     Mental Status: He is alert. Mental status is at baseline.  Psychiatric:        Mood and Affect: Mood normal.        Behavior: Behavior normal.     ED Results / Procedures / Treatments   Labs (all labs ordered are listed, but only abnormal results are displayed) Labs Reviewed - No data to display  EKG EKG Interpretation  Date/Time:  Monday April 15 2019 18:36:56 EST Ventricular Rate:  114 PR Interval:    QRS Duration: 107 QT Interval:  356 QTC Calculation: 491 R Axis:   -66 Text Interpretation: Ectopic atrial tachycardia, unifocal Atrial premature complexes LVH with secondary repolarization abnormality Inferior infarct, old Anterior infarct, old Confirmed by Kennis CarinaBero, Michael [phone]([id number]) on 04/15/2019 7:03:15 PM   Radiology DG Chest Portable 1 View  Result Date: 04/15/2019 CLINICAL DATA:  85 year old male with shortness of breath. Positive COVID-19. EXAM: PORTABLE CHEST 1 VIEW COMPARISON:  Chest radiograph dated 04/07/2019. FINDINGS: Bilateral peripheral and subpleural densities with interval progression since the radiograph of 04/07/2019. There is also progression of airspace density at the left lung base with silhouetting of the left hemidiaphragm and left cardiac border concerning for worsening pneumonia. Clinical correlation is recommended. No large pleural effusion or pneumothorax. There is atherosclerotic calcification of the aorta. Coronary vascular calcification noted. No acute osseous pathology. IMPRESSION: 1. Worsening bilateral airspace densities with predominant involvement of the left lung base.  Clinical correlation and follow-up recommended. 2. Atherosclerotic calcification of the aorta. Electronically Signed   By: Elgie CollardArash  Radparvar M.D.   On: 04/15/2019 20:11    Procedures Procedures (including critical care time)  Medications Ordered in ED Medications - No data to display  ED Course  I have reviewed the triage vital signs and the nursing notes.  Pertinent labs & imaging results that were available during my care of the patient were reviewed by me and considered in my medical decision making (see chart for details).    MDM Rules/Calculators/A&P  Cody Bradshaw is a 85 y.o. male with a medical history of dementia, hypertension who presents to the ED from nursing facility.  He was discharged from [facility] today for Covid admission, on 4 L of nasal cannula.  Presented to the nursing facility and they stated that his O2 sats were in the 70s on 4 L nasal cannula.  He denies any other issues or concerns.  He presents on 4 L nasal cannula satting 99%. He is unable to provide history.  Chronically ill-appearing male, nonverbal, awake and moving all extremities, no acute distress. No respiratory distress on nasal cannula 4 liters. Coarse lung sounds bilaterally. His chest xray does demonstrate worsening airspace densities, consistent with covid. Low suspicion for another infectious process at this time. He has remained stable on 4 liters oxygen for a period of observation of 4 hours. No indication for further testing or treatment at this time. He is ready for discharge. Discussed plan with his facility, they understand and are amenable.     Final Clinical Impression(s) / ED Diagnoses Final diagnoses:  [id number]    Rx / DC Orders ED Discharge Orders    None       Mannat Benedetti, Lovena Le, MD 10-May-2019 0002    Maudie Flakes, MD 05-10-19 [phone]

## 2019-04-15 NOTE — ED Notes (Signed)
[facility] called for report prior to dc pt, RN unable to get report at this time. RN to call back for update and report on Cody Bradshaw. [facility] contacted for pt's transportation back to SNF.

## 2019-04-15 NOTE — ED Triage Notes (Signed)
PT DC'D from [facility] this afternoon and sent to [facility] place. Where the staff reported they could not take care of his needs and called EMS.  On arrival pt in no acute resp distress.  Hx of dementia and will not answer any questions.  No outward pain responses noted.

## 2019-04-15 NOTE — ED Notes (Signed)
[phone] shelby wife when you can

## 2019-04-15 NOTE — ED Notes (Signed)
Nurse Navigator called family member to update her.  Wife states that she does not wish for pt to go back to [facility] and she states that she will take him home with her if he is discharged.

## 2019-04-15 NOTE — ED Notes (Signed)
Report given to [employer], pt ready to go when [facility] able to transport.

## 2019-04-15 NOTE — TOC Transition Note (Signed)
Transition of Care [facility]) - CM/SW Discharge Note   Patient Details  Name: Cody Bradshaw MRN: [id number] Date of Birth: [birth year]  [facility] CM/SW Contact:  Amador Cunas, [place] Phone Number: 04/15/2019, 10:27 AM   Clinical Narrative:   Pt for dc to [facility] today. No auth needed due to [facility] waiver in place. PASRR remains pending, but [facility] has temporary suspension of PASRR requirement due to COVID-19 pandemic. Spoke to [facility] in [place] admissions who confirmed they are prepared to admit pt to room 801-B today. RN to call report to [phone]. Pt's dtr aware of dc and reports agreeable. SW signing off at d/c.   Wandra Feinstein, MSW, LCSW [phone] (GV coverage)       Final next level of care: Skilled Nursing Facility Barriers to Discharge: No Barriers Identified   Patient Goals and CMS Choice Patient states their goals for this hospitalization and ongoing recovery are:: to return home CMS Medicare.gov Compare Post Acute Care list provided to:: Other (Comment Required)(dtr) Choice offered to / list presented to : Adult Children  Discharge Placement              Patient [facility] bed at: [facility] Patient to be transferred to facility by: [facility] Name of family member notified: Mliss Sax Patient and family notified of of transfer: 04/15/19  Discharge Plan and Services     Post Acute Care Choice: Home Health                               Social Determinants of Health (SDOH) Interventions     Readmission Risk Interventions No flowsheet data found.

## 2019-04-15 NOTE — ED Notes (Signed)
Nurse Navigator spoke with wife and updated that pt will be transported back to [facility]

## 2019-04-15 NOTE — Discharge Summary (Signed)
Physician Discharge Summary  Cody Bradshaw DGU:[id number] DOB: [birth year] DOA: 04/05/2019  PCP: System, Provider Not In  Admit date: 04/05/2019 Discharge date: 04/15/2019  Admitted From: Home Disposition:  SNF  Recommendations for Outpatient Follow-up:  1. Follow up with PCP in 1-2 weeks 2. Please obtain BMP/CBC in one week 3. Family to meet with palliative care at facility to address goals of care and end-of-life.   Home Health:no Equipment/Devices:None  Discharge Condition:Stable CODE STATUS:Full Diet recommendation: Heart Healthy   Brief/Interim Summary: 85 y.o. male past medical history of dementia, who presents as a transfer from [facility] for respiratory failure with hypoxia due to COVID-19.  Patient has been complaining of shortness of breath for several days prior to admission tested positive for COVID-19 2 days prior to admission, EMS was called by facility as she had a temperature 104 and her saturations were 64% on room air placed on oxygen and her saturations improved to over 88%, was started on IV remdesivir and steroids and transferred to [facility] for further management.  Discharge Diagnoses:  Principal Problem:   Acute respiratory failure with hypoxia (HCC) Active Problems:   Hypertension   GERD (gastroesophageal reflux disease)   Dementia (HCC)   Bradycardia   Pneumonia due to COVID-19 virus Acute respiratory failure with hypoxia secondary to pneumonia due to COVID-19: He was started on supplemental oxygen to keep saturations greater than 92%. He was started on IV remdesivir, steroids vitamin C and zinc. He did receive Actemra and convalescent plasma on 04/14/2019. Continue steroids for 10 days to skilled with 3 more days of oral steroids.  Acute metabolic encephalopathy, possibly acute confusional state: He had no focal deficit on physical exam. He was started on Seroquel, and he was provided with conservative measures, reorienting came in getting out of  the chair and he remained stable.    1/2 blood cultures positive for staph: Likely a contaminant.  Hypothermia: Resolved likely due to infectious etiology.  Bradycardia: Patient is on no AV nodal blocking agents, his timolol eyedrops were held on admission, twelve-lead EKG showed first-degree AV block with a right bundle branch block expected to be changes. His heart rate came right back up he was restarted on his medications.  History of glaucoma: His atenolol was held on admission due to bradycardia all the other medications were continued he will resume Timolol as an outpatient.  Abnormal TSH: We will need to be repeated in 6-week.  Normocytic anemia: No signs of overt bleeding.   Discharge Instructions  Discharge Instructions    MyChart COVID-19 home monitoring program   Complete by: Apr 13, 2019    Is the patient willing to use the [place] for home monitoring?: No   Diet - low sodium heart healthy   Complete by: As directed    Diet - low sodium heart healthy   Complete by: As directed    Increase activity slowly   Complete by: As directed    Increase activity slowly   Complete by: As directed      Allergies as of 04/15/2019      Reactions   Penicillins Shortness Of Breath      Medication List    TAKE these medications   acetaminophen 500 MG tablet Commonly known as: TYLENOL Take 1,000 mg by mouth every 6 (six) hours as needed for fever.   amLODipine 2.5 MG tablet Commonly known as: NORVASC Take 2.5 mg by mouth daily.   carboxymethylcellulose 0.5 % Soln Commonly known as: REFRESH  PLUS Place 1 drop into both eyes 2 (two) times daily as needed (dry eyes).   dexamethasone 6 MG tablet Commonly known as: DECADRON Take 1 tablet (6 mg total) by mouth daily for 2 days.   latanoprost 0.005 % ophthalmic solution Commonly known as: XALATAN Place 1 drop into both eyes at bedtime.   lisinopril-hydrochlorothiazide 20-12.5 MG tablet Commonly known  as: ZESTORETIC Take 1 tablet by mouth daily.   memantine 10 MG tablet Commonly known as: NAMENDA Take 10 mg by mouth 2 (two) times daily.   SIMBRINZA OP Place 1 drop into both eyes 3 (three) times daily.   timolol 0.5 % ophthalmic gel-forming Commonly known as: TIMOPTIC-XR Place 1 drop into both eyes 2 (two) times daily.       Allergies  Allergen Reactions  . Penicillins Shortness Of Breath    Consultations:  None   Procedures/Studies: DG CHEST PORT 1 VIEW  Result Date: 04/07/2019 CLINICAL DATA:  Pneumonia due to COVID-19 virus. EXAM: PORTABLE CHEST 1 VIEW COMPARISON:  April 05, 2019. FINDINGS: Stable cardiomediastinal silhouette. Stable bibasilar opacities are noted concerning for pneumonia. Atherosclerosis of thoracic aorta is noted. No pneumothorax or pleural effusion is noted. Bony thorax is unremarkable. IMPRESSION: Aortic atherosclerosis. Stable bibasilar opacities are noted concerning for pneumonia. Follow-up radiographs are recommended to ensure resolution. Electronically Signed   By: Lupita Raider M.D.   On: 04/07/2019 08:58    Subjective: Nonverbal but definitely more awake today.  Discharge Exam: Vitals:   04/15/19 0400 04/15/19 0424  BP: (!) 167/93 125/82  Pulse: (!) 55 (!) 55  Resp: 14 17  Temp: 97.8 F (36.6 C)   SpO2: 97% 97%   Vitals:   04/14/19 2004 04/15/19 0000 04/15/19 0400 04/15/19 0424  BP: 123/85 128/85 (!) 167/93 125/82  Pulse: (!) 56 66 (!) 55 (!) 55  Resp: 19 14 14 17   Temp: (!) 97.5 F (36.4 C) 97.9 F (36.6 C) 97.8 F (36.6 C)   TempSrc: Axillary Axillary Axillary   SpO2: 97% 96% 97% 97%  Weight:      Height:        General: Pt is alert, awake, not in acute distress Cardiovascular: RRR, S1/S2 +, no rubs, no gallops Respiratory: CTA bilaterally, no wheezing, no rhonchi Abdominal: Soft, NT, ND, bowel sounds + Extremities: no edema, no cyanosis    The results of significant diagnostics from this hospitalization  (including imaging, microbiology, ancillary and laboratory) are listed below for reference.     Microbiology: Recent Results (from the past 240 hour(s))  Culture, blood (routine x 2)     Status: None   Collection Time: 04/07/19  9:20 AM   Specimen: BLOOD  Result Value Ref Range Status   Specimen Description   Final    BLOOD RIGHT ANTECUBITAL Performed at [facility], [address] [place], [place]    Special Requests   Final    BOTTLES DRAWN AEROBIC ONLY Blood Culture adequate volume Performed at [facility], [address] [place], [place]    Culture   Final    NO GROWTH 5 DAYS Performed at [facility], [address], [place], [place]    Report Status 04/12/2019 FINAL  Final  Culture, blood (routine x 2)     Status: None   Collection Time: 04/07/19  9:25 AM   Specimen: BLOOD  Result Value Ref Range Status   Specimen Description   Final  BLOOD LEFT ANTECUBITAL Performed at [facility], [address] [place], [place] [address]    Special Requests   Final    BOTTLES DRAWN AEROBIC ONLY Blood Culture adequate volume Performed at [facility], [address], [place], [place] [address]    Culture   Final    NO GROWTH 5 DAYS Performed at [facility], [address], [place], [place] [address]    Report Status 04/12/2019 FINAL  Final     Labs: BNP (last 3 results) Recent Labs    04/06/19 0420  BNP 307.7*   Basic Metabolic Panel: Recent Labs  Lab 04/09/19 0500 04/10/19 0150 04/11/19 0340 04/12/19 0056 04/13/19 0128  NA 145 149* 145 144 144  K 4.1 4.3 4.2 4.1 4.5  CL 109 113* 109 109 110  CO2 GLUCOSE 168* 163* 181* 190* 161*  BUN 50* 52* 50* 48* 54*  CREATININE 1.19 1.11 1.14 1.11 1.21  CALCIUM 9.6 9.5 9.6 9.7 9.4   Liver Function Tests: Recent Labs  Lab 04/09/19 0500 04/10/19 0150 04/11/19 0340  AST 48* 36 27   ALT 41 37 32  ALKPHOS 50 45 57  BILITOT 0.8 0.8 0.8  PROT 6.4* 5.6* 5.8*  ALBUMIN 3.1* 2.8* 2.8*   No results for input(s): LIPASE, AMYLASE in the last 168 hours. No results for input(s): AMMONIA in the last 168 hours. CBC: Recent Labs  Lab 04/09/19 0500 04/10/19 0150 04/11/19 0340  WBC 5.2 6.6 6.8  NEUTROABS 4.6 5.5 5.7  HGB 12.9* 12.0* 12.6*  HCT 40.8 37.7* 39.8  MCV 91.1 91.3 91.5  PLT 236 233 230   Cardiac Enzymes: No results for input(s): CKTOTAL, CKMB, CKMBINDEX, TROPONINI in the last 168 hours. BNP: Invalid input(s): POCBNP CBG: Recent Labs  Lab 04/11/19 1228  GLUCAP 192*   D-Dimer No results for input(s): DDIMER in the last 72 hours. Hgb A1c No results for input(s): HGBA1C in the last 72 hours. Lipid Profile No results for input(s): CHOL, HDL, LDLCALC, TRIG, CHOLHDL, LDLDIRECT in the last 72 hours. Thyroid function studies No results for input(s): TSH, T4TOTAL, T3FREE, THYROIDAB in the last 72 hours.  Invalid input(s): FREET3 Anemia work up No results for input(s): VITAMINB12, FOLATE, FERRITIN, TIBC, IRON, RETICCTPCT in the last 72 hours. Urinalysis No results found for: COLORURINE, APPEARANCEUR, LABSPEC, PHURINE, GLUCOSEU, HGBUR, BILIRUBINUR, KETONESUR, PROTEINUR, UROBILINOGEN, NITRITE, LEUKOCYTESUR Sepsis Labs Invalid input(s): PROCALCITONIN,  WBC,  LACTICIDVEN Microbiology Recent Results (from the past 240 hour(s))  Culture, blood (routine x 2)     Status: None   Collection Time: 04/07/19  9:20 AM   Specimen: BLOOD  Result Value Ref Range Status   Specimen Description   Final    BLOOD RIGHT ANTECUBITAL Performed at [facility], [address], [place], [place] [address]    Special Requests   Final    BOTTLES DRAWN AEROBIC ONLY Blood Culture adequate volume Performed at [facility], [address] [place], [place] [address]    Culture   Final    NO GROWTH 5 DAYS Performed at [facility],  [address] [place], [place] [address]    Report Status 04/12/2019 FINAL  Final  Culture, blood (routine x 2)     Status: None   Collection Time: 04/07/19  9:25 AM   Specimen: BLOOD  Result Value Ref Range Status   Specimen Description   Final    BLOOD LEFT  ANTECUBITAL Performed at [facility], [address] [place], [place] [address]    Special Requests   Final    BOTTLES DRAWN AEROBIC ONLY Blood Culture adequate volume Performed at [facility], [address] [place], [place] [address]    Culture   Final    NO GROWTH 5 DAYS Performed at [facility], [address] [place], [place] [address]    Report Status 04/12/2019 FINAL  Final     Time coordinating discharge: Over 30 minutes  SIGNED:   Marinda ElkAbraham Feliz Ortiz, MD  [facility] 04/15/2019, 7:47 AM Pager   If 7PM-7AM, please contact night-coverage [email or web address] Password [email or web address]

## 2019-04-16 ENCOUNTER — Inpatient Hospital Stay (HOSPITAL_COMMUNITY)
Admission: EM | Admit: 2019-04-16 | Discharge: 2019-04-26 | DRG: 177 | Disposition: E | Payer: Medicare Other | Attending: Internal Medicine | Admitting: Internal Medicine

## 2019-04-16 ENCOUNTER — Encounter (HOSPITAL_COMMUNITY): Payer: Self-pay | Admitting: Emergency Medicine

## 2019-04-16 ENCOUNTER — Emergency Department (HOSPITAL_COMMUNITY): Payer: Medicare Other

## 2019-04-16 DIAGNOSIS — E861 Hypovolemia: Secondary | ICD-10-CM | POA: Diagnosis present

## 2019-04-16 DIAGNOSIS — N183 Chronic kidney disease, stage 3 unspecified: Secondary | ICD-10-CM | POA: Diagnosis present

## 2019-04-16 DIAGNOSIS — N179 Acute kidney failure, unspecified: Secondary | ICD-10-CM | POA: Diagnosis present

## 2019-04-16 DIAGNOSIS — G9341 Metabolic encephalopathy: Secondary | ICD-10-CM | POA: Diagnosis present

## 2019-04-16 DIAGNOSIS — Z800 Family history of malignant neoplasm of digestive organs: Secondary | ICD-10-CM

## 2019-04-16 DIAGNOSIS — Z6820 Body mass index (BMI) 20.0-20.9, adult: Secondary | ICD-10-CM

## 2019-04-16 DIAGNOSIS — R17 Unspecified jaundice: Secondary | ICD-10-CM | POA: Diagnosis present

## 2019-04-16 DIAGNOSIS — Z515 Encounter for palliative care: Secondary | ICD-10-CM | POA: Diagnosis not present

## 2019-04-16 DIAGNOSIS — Z79899 Other long term (current) drug therapy: Secondary | ICD-10-CM | POA: Diagnosis not present

## 2019-04-16 DIAGNOSIS — E870 Hyperosmolality and hypernatremia: Secondary | ICD-10-CM | POA: Diagnosis present

## 2019-04-16 DIAGNOSIS — E162 Hypoglycemia, unspecified: Secondary | ICD-10-CM | POA: Diagnosis not present

## 2019-04-16 DIAGNOSIS — J9601 Acute respiratory failure with hypoxia: Secondary | ICD-10-CM | POA: Diagnosis present

## 2019-04-16 DIAGNOSIS — E872 Acidosis: Secondary | ICD-10-CM | POA: Diagnosis present

## 2019-04-16 DIAGNOSIS — Z66 Do not resuscitate: Secondary | ICD-10-CM | POA: Diagnosis present

## 2019-04-16 DIAGNOSIS — R680 Hypothermia, not associated with low environmental temperature: Secondary | ICD-10-CM | POA: Diagnosis not present

## 2019-04-16 DIAGNOSIS — I4891 Unspecified atrial fibrillation: Secondary | ICD-10-CM | POA: Diagnosis present

## 2019-04-16 DIAGNOSIS — I129 Hypertensive chronic kidney disease with stage 1 through stage 4 chronic kidney disease, or unspecified chronic kidney disease: Secondary | ICD-10-CM | POA: Diagnosis present

## 2019-04-16 DIAGNOSIS — Z880 Allergy status to penicillin: Secondary | ICD-10-CM

## 2019-04-16 DIAGNOSIS — E860 Dehydration: Secondary | ICD-10-CM | POA: Diagnosis present

## 2019-04-16 DIAGNOSIS — R627 Adult failure to thrive: Secondary | ICD-10-CM | POA: Diagnosis present

## 2019-04-16 DIAGNOSIS — I10 Essential (primary) hypertension: Secondary | ICD-10-CM | POA: Diagnosis present

## 2019-04-16 DIAGNOSIS — H409 Unspecified glaucoma: Secondary | ICD-10-CM | POA: Diagnosis present

## 2019-04-16 DIAGNOSIS — Z820 Family history of epilepsy and other diseases of the nervous system: Secondary | ICD-10-CM

## 2019-04-16 DIAGNOSIS — R0602 Shortness of breath: Secondary | ICD-10-CM | POA: Diagnosis not present

## 2019-04-16 DIAGNOSIS — K219 Gastro-esophageal reflux disease without esophagitis: Secondary | ICD-10-CM | POA: Diagnosis present

## 2019-04-16 DIAGNOSIS — F0390 Unspecified dementia without behavioral disturbance: Secondary | ICD-10-CM | POA: Diagnosis present

## 2019-04-16 DIAGNOSIS — U071 COVID-19: Principal | ICD-10-CM | POA: Diagnosis present

## 2019-04-16 DIAGNOSIS — Z7189 Other specified counseling: Secondary | ICD-10-CM

## 2019-04-16 DIAGNOSIS — I493 Ventricular premature depolarization: Secondary | ICD-10-CM | POA: Diagnosis not present

## 2019-04-16 DIAGNOSIS — Z781 Physical restraint status: Secondary | ICD-10-CM

## 2019-04-16 DIAGNOSIS — I252 Old myocardial infarction: Secondary | ICD-10-CM

## 2019-04-16 DIAGNOSIS — J1289 Other viral pneumonia: Secondary | ICD-10-CM | POA: Diagnosis present

## 2019-04-16 DIAGNOSIS — J690 Pneumonitis due to inhalation of food and vomit: Secondary | ICD-10-CM | POA: Diagnosis not present

## 2019-04-16 HISTORY — DX: COVID-19: U07.1

## 2019-04-16 LAB — FERRITIN: Ferritin: 636 ng/mL — ABNORMAL HIGH (ref 24–336)

## 2019-04-16 LAB — TRIGLYCERIDES: Triglycerides: 258 mg/dL — ABNORMAL HIGH (ref <150)

## 2019-04-16 LAB — COMPREHENSIVE METABOLIC PANEL
ALT: 52 U/L — ABNORMAL HIGH (ref 0–44)
AST: 34 U/L (ref 15–41)
Albumin: 3.4 g/dL — ABNORMAL LOW (ref 3.5–5.0)
Alkaline Phosphatase: 80 U/L (ref 38–126)
Anion gap: 13 (ref 5–15)
BUN: 63 mg/dL — ABNORMAL HIGH (ref 8–23)
CO2: 23 mmol/L (ref 22–32)
Calcium: 10.2 mg/dL (ref 8.9–10.3)
Chloride: 117 mmol/L — ABNORMAL HIGH (ref 98–111)
Creatinine, Ser: 1.56 mg/dL — ABNORMAL HIGH (ref 0.61–1.24)
GFR calc Af Amer: 46 mL/min — ABNORMAL LOW (ref >60)
GFR calc non Af Amer: 39 mL/min — ABNORMAL LOW (ref >60)
Glucose, Bld: 132 mg/dL — ABNORMAL HIGH (ref 70–99)
Potassium: 4.3 mmol/L (ref 3.5–5.1)
Sodium: 153 mmol/L — ABNORMAL HIGH (ref 135–145)
Total Bilirubin: 1.4 mg/dL — ABNORMAL HIGH (ref 0.3–1.2)
Total Protein: 6.8 g/dL (ref 6.5–8.1)

## 2019-04-16 LAB — CBC WITH DIFFERENTIAL/PLATELET
Abs Immature Granulocytes: 0.27 10*3/uL — ABNORMAL HIGH (ref 0.00–0.07)
Basophils Absolute: 0.1 10*3/uL (ref 0.0–0.1)
Basophils Relative: 1 %
Eosinophils Absolute: 0 10*3/uL (ref 0.0–0.5)
Eosinophils Relative: 0 %
HCT: 55.9 % — ABNORMAL HIGH (ref 39.0–52.0)
Hemoglobin: 17.2 g/dL — ABNORMAL HIGH (ref 13.0–17.0)
Immature Granulocytes: 2 %
Lymphocytes Relative: 3 %
Lymphs Abs: 0.5 10*3/uL — ABNORMAL LOW (ref 0.7–4.0)
MCH: 28.9 pg (ref 26.0–34.0)
MCHC: 30.8 g/dL (ref 30.0–36.0)
MCV: 93.8 fL (ref 80.0–100.0)
Monocytes Absolute: 1.1 10*3/uL — ABNORMAL HIGH (ref 0.1–1.0)
Monocytes Relative: 8 %
Neutro Abs: 11.9 10*3/uL — ABNORMAL HIGH (ref 1.7–7.7)
Neutrophils Relative %: 86 %
Platelets: 270 10*3/uL (ref 150–400)
RBC: 5.96 MIL/uL — ABNORMAL HIGH (ref 4.22–5.81)
RDW: 15.9 % — ABNORMAL HIGH (ref 11.5–15.5)
WBC: 13.8 10*3/uL — ABNORMAL HIGH (ref 4.0–10.5)
nRBC: 0 % (ref 0.0–0.2)

## 2019-04-16 LAB — LACTIC ACID, PLASMA
Lactic Acid, Venous: 3.5 mmol/L (ref 0.5–1.9)
Lactic Acid, Venous: 2.7 mmol/L (ref 0.5–1.9)

## 2019-04-16 LAB — T4, FREE: Free T4: 1.57 ng/dL — ABNORMAL HIGH (ref 0.61–1.12)

## 2019-04-16 LAB — HEPARIN LEVEL (UNFRACTIONATED): Heparin Unfractionated: 1.46 IU/mL — ABNORMAL HIGH (ref 0.30–0.70)

## 2019-04-16 LAB — MAGNESIUM: Magnesium: 3.2 mg/dL — ABNORMAL HIGH (ref 1.7–2.4)

## 2019-04-16 LAB — GLUCOSE, CAPILLARY: Glucose-Capillary: 135 mg/dL — ABNORMAL HIGH (ref 70–99)

## 2019-04-16 LAB — D-DIMER, QUANTITATIVE: D-Dimer, Quant: 2.24 ug/mL-FEU — ABNORMAL HIGH (ref 0.00–0.50)

## 2019-04-16 LAB — TSH: TSH: 0.372 u[IU]/mL (ref 0.350–4.500)

## 2019-04-16 LAB — LACTATE DEHYDROGENASE: LDH: 480 U/L — ABNORMAL HIGH (ref 98–192)

## 2019-04-16 LAB — C-REACTIVE PROTEIN: CRP: <0.5 mg/dL (ref <1.0)

## 2019-04-16 LAB — PHOSPHORUS: Phosphorus: 3.7 mg/dL (ref 2.5–4.6)

## 2019-04-16 LAB — PROCALCITONIN: Procalcitonin: <0.1 ng/mL

## 2019-04-16 LAB — FIBRINOGEN: Fibrinogen: 382 mg/dL (ref 210–475)

## 2019-04-16 MED ORDER — ACETAMINOPHEN 325 MG PO TABS: 650.0000 mg | ORAL_TABLET | Freq: Four times a day (QID) | ORAL | Status: DC | PRN

## 2019-04-16 MED ORDER — LORAZEPAM 2 MG/ML IJ SOLN
1.0000 mg | Freq: Once | INTRAMUSCULAR | Status: AC
  Administered 2019-04-16: 12:00:00 1 mg via INTRAVENOUS
  Filled 2019-04-16: qty 1

## 2019-04-16 MED ORDER — DEXAMETHASONE SODIUM PHOSPHATE 10 MG/ML IJ SOLN
6.0000 mg | Freq: Once | INTRAMUSCULAR | Status: AC
  Administered 2019-04-16: 11:00:00 6 mg via INTRAVENOUS
  Filled 2019-04-16: qty 1

## 2019-04-16 MED ORDER — BRIMONIDINE TARTRATE 0.2 % OP SOLN
1.0000 [drp] | Freq: Three times a day (TID) | OPHTHALMIC | Status: DC
  Administered 2019-04-17 – 2019-04-22 (×9): 1 [drp] via OPHTHALMIC
  Filled 2019-04-16 (×2): qty 5

## 2019-04-16 MED ORDER — DEXAMETHASONE SODIUM PHOSPHATE 10 MG/ML IJ SOLN
10.0000 mg | INTRAMUSCULAR | Status: DC
  Administered 2019-04-17: 09:00:00 10 mg via INTRAVENOUS
  Filled 2019-04-16: qty 1

## 2019-04-16 MED ORDER — BRINZOLAMIDE-BRIMONIDINE 1-0.2 % OP SUSP: 1.0000 [drp] | Freq: Three times a day (TID) | OPHTHALMIC | Status: DC

## 2019-04-16 MED ORDER — IPRATROPIUM-ALBUTEROL 20-100 MCG/ACT IN AERS: 1.0000 | INHALATION_SPRAY | Freq: Four times a day (QID) | RESPIRATORY_TRACT | Status: DC

## 2019-04-16 MED ORDER — SODIUM CHLORIDE 0.9 % IV BOLUS
1000.0000 mL | Freq: Once | INTRAVENOUS | Status: AC
  Administered 2019-04-16: 14:00:00 1000 mL via INTRAVENOUS

## 2019-04-16 MED ORDER — MORPHINE SULFATE (PF) 4 MG/ML IV SOLN
4.0000 mg | Freq: Once | INTRAVENOUS | Status: AC
  Administered 2019-04-16: 13:00:00 4 mg via INTRAVENOUS
  Filled 2019-04-16: qty 1

## 2019-04-16 MED ORDER — DILTIAZEM LOAD VIA INFUSION
10.0000 mg | Freq: Once | INTRAVENOUS | Status: AC
  Administered 2019-04-16: 12:00:00 10 mg via INTRAVENOUS
  Filled 2019-04-16: qty 10

## 2019-04-16 MED ORDER — SODIUM CHLORIDE 0.9 % IV SOLN: INTRAVENOUS | Status: DC

## 2019-04-16 MED ORDER — POLYETHYLENE GLYCOL 3350 17 G PO PACK: 17.0000 g | PACK | Freq: Every day | ORAL | Status: DC | PRN

## 2019-04-16 MED ORDER — BRINZOLAMIDE 1 % OP SUSP
1.0000 [drp] | Freq: Three times a day (TID) | OPHTHALMIC | Status: DC
  Administered 2019-04-17 – 2019-04-22 (×9): 1 [drp] via OPHTHALMIC
  Filled 2019-04-16: qty 10

## 2019-04-16 MED ORDER — POLYVINYL ALCOHOL 1.4 % OP SOLN
1.0000 [drp] | Freq: Two times a day (BID) | OPHTHALMIC | Status: DC | PRN
  Filled 2019-04-16: qty 15

## 2019-04-16 MED ORDER — SODIUM CHLORIDE 0.9 % IV BOLUS
500.0000 mL | Freq: Once | INTRAVENOUS | Status: AC
  Administered 2019-04-16: 15:00:00 500 mL via INTRAVENOUS

## 2019-04-16 MED ORDER — INSULIN ASPART 100 UNIT/ML ~~LOC~~ SOLN: 0.0000 [IU] | Freq: Every day | SUBCUTANEOUS | Status: DC

## 2019-04-16 MED ORDER — LORAZEPAM 2 MG/ML IJ SOLN: 1.0000 mg | Freq: Once | INTRAMUSCULAR | Status: DC

## 2019-04-16 MED ORDER — TIMOLOL MALEATE 0.5 % OP SOLG
1.0000 [drp] | Freq: Two times a day (BID) | OPHTHALMIC | Status: DC
  Filled 2019-04-16: qty 5

## 2019-04-16 MED ORDER — IPRATROPIUM-ALBUTEROL 20-100 MCG/ACT IN AERS
1.0000 | INHALATION_SPRAY | Freq: Four times a day (QID) | RESPIRATORY_TRACT | Status: DC
  Administered 2019-04-16 – 2019-04-18 (×3): 1 via RESPIRATORY_TRACT
  Filled 2019-04-16: qty 4

## 2019-04-16 MED ORDER — ONDANSETRON HCL 4 MG PO TABS: 4.0000 mg | ORAL_TABLET | Freq: Four times a day (QID) | ORAL | Status: DC | PRN

## 2019-04-16 MED ORDER — DILTIAZEM HCL-DEXTROSE 125-5 MG/125ML-% IV SOLN (PREMIX)
5.0000 mg/h | INTRAVENOUS | Status: DC
  Administered 2019-04-16: 12:00:00 5 mg/h via INTRAVENOUS
  Administered 2019-04-17: 02:00:00 10 mg/h via INTRAVENOUS
  Administered 2019-04-18 (×2): 15 mg/h via INTRAVENOUS
  Filled 2019-04-16 (×6): qty 125

## 2019-04-16 MED ORDER — LORAZEPAM 2 MG/ML IJ SOLN
0.5000 mg | Freq: Once | INTRAMUSCULAR | Status: AC
  Administered 2019-04-16: 13:00:00 0.5 mg via INTRAVENOUS
  Filled 2019-04-16: qty 1

## 2019-04-16 MED ORDER — ACETAMINOPHEN 650 MG RE SUPP: 650.0000 mg | Freq: Four times a day (QID) | RECTAL | Status: DC | PRN

## 2019-04-16 MED ORDER — HEPARIN (PORCINE) 25000 UT/250ML-% IV SOLN
950.0000 [IU]/h | INTRAVENOUS | Status: DC
  Administered 2019-04-16: 15:00:00 950 [IU]/h via INTRAVENOUS
  Filled 2019-04-16: qty 250

## 2019-04-16 MED ORDER — CARBOXYMETHYLCELLULOSE SODIUM 0.5 % OP SOLN: 1.0000 [drp] | Freq: Two times a day (BID) | OPHTHALMIC | Status: DC | PRN

## 2019-04-16 MED ORDER — INSULIN ASPART 100 UNIT/ML ~~LOC~~ SOLN
0.0000 [IU] | Freq: Three times a day (TID) | SUBCUTANEOUS | Status: DC
  Administered 2019-04-17: 17:00:00 1 [IU] via SUBCUTANEOUS
  Administered 2019-04-18: 08:00:00 2 [IU] via SUBCUTANEOUS
  Administered 2019-04-18: 13:00:00 1 [IU] via SUBCUTANEOUS

## 2019-04-16 MED ORDER — ONDANSETRON HCL 4 MG/2ML IJ SOLN
4.0000 mg | Freq: Four times a day (QID) | INTRAMUSCULAR | Status: DC | PRN
  Administered 2019-04-21: 03:00:00 4 mg via INTRAVENOUS
  Filled 2019-04-16: qty 2

## 2019-04-16 MED ORDER — LATANOPROST 0.005 % OP SOLN
1.0000 [drp] | Freq: Every day | OPHTHALMIC | Status: DC
  Administered 2019-04-18: 23:00:00 1 [drp] via OPHTHALMIC
  Filled 2019-04-16: qty 2.5

## 2019-04-16 MED ORDER — HEPARIN BOLUS VIA INFUSION
3000.0000 [IU] | Freq: Once | INTRAVENOUS | Status: AC
  Administered 2019-04-16: 15:00:00 3000 [IU] via INTRAVENOUS
  Filled 2019-04-16: qty 3000

## 2019-04-16 MED ORDER — HALOPERIDOL LACTATE 5 MG/ML IJ SOLN
5.0000 mg | Freq: Once | INTRAMUSCULAR | Status: AC
  Administered 2019-04-16: 19:00:00 5 mg via INTRAMUSCULAR

## 2019-04-16 NOTE — ED Provider Notes (Signed)
[facility] DEPT Provider Note   CSN: [id number] Arrival date & time: Apr 29, 2019  1013     History Chief Complaint  Patient presents with  . covid positive  . desaturation    Cody Bradshaw is a 85 y.o. male.  Level 5 caveat for dementia.  Patient sent from nursing facility with shortness of breath and hypoxia.  He was admitted for coronavirus to [facility] from December 11-21.  He was discharged on 4 L nasal cannula.  He was sent to [facility] last evening with concern for desaturation.  He was monitored for several hours and was sent back to his facility.  Today the facility reports he was desaturating on his nasal cannula and looking more short of breath.  Patient unable to give any history.  He is not speaking or following commands.  Patient was only in his rehab facility for less than 1 day prior to coming back to the hospital x2.  Discussed with patient's daughter by phone Cody Bradshaw.  [phone].  She states her father has dementia but was fairly independent prior to his illness.  She does confirm that he is a DNR/DNI.  She was unaware that he came back to the hospital this morning.  The history is provided by the patient, the EMS personnel and a relative. The history is limited by the condition of the patient.       Past Medical History:  Diagnosis Date  . Dementia ([place])   . GERD (gastroesophageal reflux disease)   . Hypertension   . Lab test positive for detection of COVID-19 virus   . MI (myocardial infarction) [facility])     Patient Active Problem List   Diagnosis Date Noted  . Pneumonia due to COVID-19 virus 04/10/2019  . Acute respiratory failure with hypoxia ([place]) 04/06/2019  . Bradycardia 04/06/2019  . Hypertension   . GERD (gastroesophageal reflux disease)   . Dementia ([place])     History reviewed. No pertinent surgical history.     No family history on file.  Social History   Tobacco Use  . Smoking status:  Never Smoker  . Smokeless tobacco: Never Used  Substance Use Topics  . Alcohol use: Not Currently  . Drug use: Not Currently    Home Medications Prior to Admission medications   Medication Sig Start Date End Date Taking? Authorizing Provider  acetaminophen (TYLENOL) 500 MG tablet Take 1,000 mg by mouth every 6 (six) hours as needed for fever.    [provider]  amLODipine (NORVASC) 2.5 MG tablet Take 2.5 mg by mouth daily.    [provider]  Brinzolamide-Brimonidine [facility] OP) Place 1 drop into both eyes 3 (three) times daily.    [provider]  carboxymethylcellulose (REFRESH PLUS) 0.5 % SOLN Place 1 drop into both eyes 2 (two) times daily as needed (dry eyes).    [provider]  dexamethasone (DECADRON) 6 MG tablet Take 1 tablet (6 mg total) by mouth daily for 2 days. 04/14/19 29-Apr-2019  Charlynne Cousins, MD  latanoprost (XALATAN) 0.005 % ophthalmic solution Place 1 drop into both eyes at bedtime.    [provider]  lisinopril-hydrochlorothiazide (ZESTORETIC) 20-12.5 MG tablet Take 1 tablet by mouth daily.    [provider]  memantine (NAMENDA) 10 MG tablet Take 10 mg by mouth 2 (two) times daily.    [provider]  timolol (TIMOPTIC-XR) 0.5 % ophthalmic gel-forming Place 1 drop into both eyes 2 (two) times daily.  [provider]    Allergies    Penicillins  Review of Systems   Review of Systems  Unable to perform ROS: Dementia    Physical Exam Updated Vital Signs BP (!) 137/91 (BP Location: Left Arm)   Pulse 94   Temp 98 F (36.7 C) (Rectal)   Resp (!) 32   SpO2 (!) 84%   Physical Exam Constitutional:      General: He is in acute distress.     Appearance: He is ill-appearing.     Comments: Chronically ill-appearing, tachypnea  Nonverbal, does not follow commands  HENT:     Mouth/Throat:     Mouth: Mucous membranes are dry.  Eyes:     Pupils: Pupils are equal, round, and  reactive to light.  Cardiovascular:     Rate and Rhythm: Tachycardia present. Rhythm irregular.  Pulmonary:     Effort: Respiratory distress present.     Breath sounds: Rhonchi present.     Comments: Increased work of breathing, bilateral rhonchi Abdominal:     Tenderness: There is no abdominal tenderness. There is no guarding or rebound.  Musculoskeletal:        General: No swelling or tenderness.  Skin:    Capillary Refill: Capillary refill takes less than 2 seconds.  Neurological:     Comments: Obtunded, Does not speak or follow commands, moves extremities spontaneously     ED Results / Procedures / Treatments   Labs (all labs ordered are listed, but only abnormal results are displayed) Labs Reviewed  CBC WITH DIFFERENTIAL/PLATELET - Abnormal; Notable for the following components:      Result Value   WBC 13.8 (*)    RBC 5.96 (*)    Hemoglobin 17.2 (*)    HCT 55.9 (*)    RDW 15.9 (*)    Neutro Abs 11.9 (*)    Lymphs Abs 0.5 (*)    Monocytes Absolute 1.1 (*)    Abs Immature Granulocytes 0.27 (*)    All other components within normal limits  COMPREHENSIVE METABOLIC PANEL - Abnormal; Notable for the following components:   Sodium 153 (*)    Chloride 117 (*)    Glucose, Bld 132 (*)    BUN 63 (*)    Creatinine, Ser 1.56 (*)    Albumin 3.4 (*)    ALT 52 (*)    Total Bilirubin 1.4 (*)    GFR calc non Af Amer 39 (*)    GFR calc Af Amer 46 (*)    All other components within normal limits  LACTATE DEHYDROGENASE - Abnormal; Notable for the following components:   LDH 480 (*)    All other components within normal limits  TRIGLYCERIDES - Abnormal; Notable for the following components:   Triglycerides 258 (*)    All other components within normal limits  CULTURE, BLOOD (ROUTINE X 2)  CULTURE, BLOOD (ROUTINE X 2)  LACTIC ACID, PLASMA  LACTIC ACID, PLASMA  D-DIMER, QUANTITATIVE (NOT AT [facility])  PROCALCITONIN  FERRITIN  FIBRINOGEN  C-REACTIVE PROTEIN    EKG EKG  Interpretation  Date/Time:  Tuesday April 16 2019 10:35:13 EST Ventricular Rate:  128 PR Interval:    QRS Duration: 102 QT Interval:  351 QTC Calculation: 513 R Axis:   -68 Text Interpretation: Atrial fibrillation Anterolateral infarct, age indeterminate Prolonged QT interval Interpretation limited secondary to artifact Confirmed by Glynn Octave [phone]) on 04/14/2019 10:53:11 AM   Radiology DG Chest Portable 1 View  Result Date: 04/15/2019 CLINICAL DATA:  85 year old  male with shortness of breath. Positive COVID-19. EXAM: PORTABLE CHEST 1 VIEW COMPARISON:  Chest radiograph dated 04/07/2019. FINDINGS: Bilateral peripheral and subpleural densities with interval progression since the radiograph of 04/07/2019. There is also progression of airspace density at the left lung base with silhouetting of the left hemidiaphragm and left cardiac border concerning for worsening pneumonia. Clinical correlation is recommended. No large pleural effusion or pneumothorax. There is atherosclerotic calcification of the aorta. Coronary vascular calcification noted. No acute osseous pathology. IMPRESSION: 1. Worsening bilateral airspace densities with predominant involvement of the left lung base. Clinical correlation and follow-up recommended. 2. Atherosclerotic calcification of the aorta. Electronically Signed   By: Elgie CollardArash  Radparvar M.D.   On: 04/15/2019 20:11    Procedures .Critical Care Performed by: Glynn Octaveancour, Dsean Vantol, MD Authorized by: Glynn Octaveancour, Elva Mauro, MD   Critical care provider statement:    Critical care time (minutes):  45   Critical care was necessary to treat or prevent imminent or life-threatening deterioration of the following conditions:  Dehydration and respiratory failure   Critical care was time spent personally by me on the following activities:  Discussions with consultants, evaluation of patient's response to treatment, examination of patient, ordering and performing treatments and  interventions, ordering and review of laboratory studies, ordering and review of radiographic studies, pulse oximetry, re-evaluation of patient's condition, obtaining history from patient or surrogate and review of old charts   (including critical care time)  Medications Ordered in ED Medications  dexamethasone (DECADRON) injection 6 mg (has no administration in time range)    ED Course  I have reviewed the triage vital signs and the nursing notes.  Pertinent labs & imaging results that were available during my care of the patient were reviewed by me and considered in my medical decision making (see chart for details).    MDM Rules/Calculators/A&P                     Patient with known coronavirus with [facility].  Return to the ED with persistent hypoxia worsening O2 requirements.  Discussed with patient's daughter by phone Cody Bradshaw.  She confirms he is DNR and DNI.  Per discharge summary, patient received steroids for 7 days and has 3 days to complete.  Also did complete remdesivir and convalescent plasma and Actemra.  Patient here with increased oxygen requirement.  Appears also to have new atrial fibrillation with RVR.  He is started on IV Cardizem.  Patient is requiring nonrebreather to maintain oxygen saturations.  He did require soft restraints to maintain his oxygen mask.  Found to have new A. fib with RVR rates of 120s and 130s.  His started on IV Cardizem.  Labs show hypernatremia and AKI which is worsening Lactic acidosis the patient does appear dry on exam.  He was given additional IV fluids.  We will continue IV Cardizem as well as IV heparin for new onset atrial fibrillation. Pulmonary embolism considered as well although patient's creatinine is not conducive to IV fluids.  Will be treated with IV heparin as above.  Admission for worsening oxygen requirement as well as new A. fib with RVR discussed with Dr. Marland McalpineSheikh who will admit  patient back to the hospital.  He remains DNR/DNI.  Will also involve palliative care. D/w RN coordinator Shawna OrleansMelanie.   Cody Bradshaw was evaluated in Emergency Department on November 27, 2018 for the symptoms described in the history of present illness. He was evaluated in the context of the global COVID-19  pandemic, which necessitated consideration that the patient might be at risk for infection with the SARS-CoV-2 virus that causes COVID-19. Institutional protocols and algorithms that pertain to the evaluation of patients at risk for COVID-19 are in a state of rapid change based on information released by regulatory bodies including the CDC and federal and state organizations. These policies and algorithms were followed during the patient's care in the ED.  Final Clinical Impression(s) / ED Diagnoses Final diagnoses:  Acute respiratory failure with hypoxia (HCC)  COVID-19 virus infection  Atrial fibrillation with RVR [facility])    Rx / DC Orders ED Discharge Orders    None       Macrina Lehnert, Jeannett Senior, MD 03/26/2019 [phone]

## 2019-04-16 NOTE — ED Notes (Signed)
CRITICAL VALUE ALERT  Critical Value:  Lactic Acid 3.45mmol/L  Date & Time Notied:  04/15/2019 1238  Provider Notified: Rancour MD  Orders Received/Actions taken: MD aware

## 2019-04-16 NOTE — Progress Notes (Signed)
Notified charge of pt needing sitter

## 2019-04-16 NOTE — Progress Notes (Signed)
Pt. remains extremely agitated and tries  to get OOB & pulls on jhis line after receiving 5Mg  Haldol &[id number].   Sitter at bedside and MD page.

## 2019-04-16 NOTE — ED Triage Notes (Signed)
Per EMS-coming from [facility] recently discharged from [facility] and [facility]-fac[facility] sent patient out due to desaturation

## 2019-04-16 NOTE — ED Notes (Signed)
Wife of patient, Cody Bradshaw, would like an update on her husband from the doctor or nurse [phone].

## 2019-04-16 NOTE — Progress Notes (Signed)
  PROGRESS NOTE    Adrien Shankar  OAC:[id number] DOB: [birth year] DOA: 04-24-19 PCP: [facility]   Brief Narrative:   Patient combative, ordered 5mg  IV haldol and safety sitter once available. It appears patient's mental status continues to be somewhat altered from baseline, follow commands during my evaluation, rolling around in bed pulling at nasal cannula, telemetry, IV tubing.  Haldol was ordered for patient safety.  Patient is calm and able to be more secured will discuss with nurses future needs, at this time would prefer safety sitter, if this is unavailable I feel that mittens and restraints if necessary would be reasonable to maintain patient safety so that we can properly treat his hypoxia and A. fib with RVR.  Assessment & Plan:   Active Problems:   Failure to thrive in adult   Atrial fibrillation with tachycardic ventricular rate ([place])   Time spent: 59min  Lota Leamer C Yuan Gann, DO [facility]  If 7PM-7AM, please contact night-coverage [email or web address] Password [facility] 2019/04/24, 7:17 PM

## 2019-04-16 NOTE — Progress Notes (Signed)
ANTICOAGULATION CONSULT NOTE - Follow Up Consult  Pharmacy Consult for heparin Indication: atrial fibrillation  Allergies  Allergen Reactions  . Penicillins Shortness Of Breath    Did it involve swelling of the face/tongue/throat, SOB, or low BP? Unknown Did it involve sudden or severe rash/hives, skin peeling, or any reaction on the inside of your mouth or nose? Unknown Did you need to seek medical attention at a hospital or doctor's office? Unknown When did it last happen? If all above answers are "NO", may proceed with cephalosporin use.     weight 68kg, height 68 inches   Heparin Dosing Weight: 68 kg  Vital Signs: Temp: 97.8 F (36.6 C) (12/22 2002) Temp Source: Oral (12/22 2002) BP: 124/100 (12/22 2002) Pulse Rate: 69 (12/22 2002)  Labs: Recent Labs    05-09-2019 1039 May 09, 2019 2225  HGB 17.2*  --   HCT 55.9*  --   PLT 270  --   HEPARINUNFRC  --  1.46*  CREATININE 1.56*  --     Estimated Creatinine Clearance: 32.1 mL/min (A) (by C-G formula based on SCr of 1.56 mg/dL (H)).   Assessment: Patient's an 85 y.o M treated at [facility] from 04/05/19 to 04/15/19 for [place].  He was discharged to [facility] place on 12/21 and came back to the ED later that day d/t low sats.  Pharmacy was consulted to start heparin drip on 12/22 for afib.  Heparin level 1.46 (supratherapeutic) on gtt at 950 units/hr. Confirmed with phlebotomy that lab was drawn from arm opposite where heparin running. No bleeding noted.  Goal of Therapy:  Heparin level 0.3-0.7 units/ml Monitor platelets by anticoagulation protocol: Yes   Plan:  Hold heparin x 1 hour Restart heparin at 700 units/hr Check 8 hr heparin level   Sherlon Handing, PharmD, BCPS Please see amion for complete clinical pharmacist phone list 04/17/2019,12:06 AM

## 2019-04-16 NOTE — ED Notes (Signed)
Called phlebotomy for lactic blood draw

## 2019-04-16 NOTE — ED Notes (Signed)
Called [facility] for transport to [place]. [facility] said they had already been dispatched for transport and did not need additional information.

## 2019-04-16 NOTE — ED Notes (Signed)
[facility] at bedside 

## 2019-04-16 NOTE — H&P (Signed)
History and Physical    Linn Goetze JKD:[id number] DOB: [birth year] DOA: 04/12/2019  PCP: [facility]   Patient coming from: SNF  Chief Complaint: SOB; Hypoxia, Confusion  HPI: Cody Bradshaw is a 85 y.o. male with medical history significant for dementia and history of myocardial infarction who was recently discharged from [facility] for treatment of COVID-19 pneumonia and was discharged yesterday but subsequently presented today with worsening hypoxia and shortness of breath as well as new onset atrial fibrillation RVR.  Of note patient is unable to provide a subjective history to his current condition and does not follow commands or answer questions and he was extremely agitated and was given lorazepam I saw him and is now somnolent.  No family is currently at bedside answer any questions and mostly history is obtained from the EDP and the medical chart.  From his skilled nursing facility for shortness of breath and hypoxia and he was in the rehab facility for less than a whole day.  The facility reported that he was desaturating on his nasal cannula but he was discharged on a little more short of breath.  TR[facility] was asked to admit this patient for acute respiratory failure with hypoxia and failure to thrive in a patient that there is a positive for Covid and treated for Covid recently.  ED Course: In the ED the patient had basic blood work done and had a chest x-ray as well and EKG and was placed on a Cardizem drip and given some IV fluid hydration.  Palliative care was consulted for further evaluation recommendations  Review of Systems: As per HPI otherwise all other systems reviewed and negative.   Past Medical History:  Diagnosis Date  . Dementia ([place])   . GERD (gastroesophageal reflux disease)   . Hypertension   . Lab test positive for detection of COVID-19 virus   . MI (myocardial infarction) [facility])    SURGICAL HISTORY  Was attempted but patient unable to  provide a surgical history at this time to his current condition and there is no surgical history on record even in care everywhere  SOCIAL HISTORY   reports that he has never smoked. He has never used smokeless tobacco. He reports previous alcohol use. He reports previous drug use.  Allergies  Allergen Reactions  . Penicillins Shortness Of Breath    Did it involve swelling of the face/tongue/throat, SOB, or low BP? Unknown Did it involve sudden or severe rash/hives, skin peeling, or any reaction on the inside of your mouth or nose? Unknown Did you need to seek medical attention at a hospital or doctor's office? Unknown When did it last happen? If all above answers are "NO", may proceed with cephalosporin use.    Family History  Was attempted but patient unable to provide a subjective history due to his current condition and there is no family history on record even in care everywhere  Prior to Admission medications   Medication Sig Start Date End Date Taking? Authorizing Provider  acetaminophen (TYLENOL) 500 MG tablet Take 1,000 mg by mouth every 6 (six) hours as needed for fever.   Yes [provider]  amLODipine (NORVASC) 2.5 MG tablet Take 2.5 mg by mouth daily.   Yes [provider]  Brinzolamide-Brimonidine [facility]) Place 1 drop into both eyes 3 (three) times daily.   Yes [provider]  dexamethasone (DECADRON) 6 MG tablet Take 1 tablet (6 mg total) by mouth daily for 2 days. 04/14/19 04/04/2019  Yes Marinda Elk, MD  lisinopril-hydrochlorothiazide (ZESTORETIC) 20-12.5 MG tablet Take 1 tablet by mouth daily.   Yes [provider]  memantine (NAMENDA) 10 MG tablet Take 10 mg by mouth 2 (two) times daily.   Yes [provider]  timolol (TIMOPTIC-XR) 0.5 % ophthalmic gel-forming Place 1 drop into both eyes 2 (two) times daily.   Yes [provider]  carboxymethylcellulose (REFRESH PLUS) 0.5 % SOLN Place 1 drop into  both eyes 2 (two) times daily as needed (dry eyes).    [provider]  latanoprost (XALATAN) 0.005 % ophthalmic solution Place 1 drop into both eyes at bedtime.    [provider]    Physical Exam: Vitals:   04/25/2019 1457 03/30/2019 1500 03/27/2019 1530 03/30/2019 1707  BP: (!) 138/95 (!) 144/98 (!) 141/93 140/84  Pulse: (!) 103 (!) 45 78 (!) 59  Resp: 16 18 14  (!) 24  Temp:      TempSrc:      SpO2: 100% 99% 100% 98%   Constitutional: Thin frail elderly chronically ill-appearing Caucasian male who is demented unable to provide any subjective history but he appears calm and sedated from his lorazepam dosage Eyes: Lids and conjunctivae normal, sclerae anicteric  ENMT: External Ears, Nose appear normal.  Does not respond when you yell at him Neck: Appears normal, supple, no cervical masses, normal ROM, no appreciable thyromegaly Respiratory: Diminished to auscultation bilaterally with coarse breath sounds, no wheezing, rales, rhonchi or crackles. Normal respiratory effort and patient is not tachypenic. No accessory muscle use.  Patient appears slightly tachypneic but stable on a nonrebreather Cardiovascular: Irregularly irregular tachycardic, no murmurs / rubs / gallops. S1 and S2 auscultated. No extremity edema.  Abdomen: Soft, non-tender, non-distended. Bowel sounds positive.  GU: Deferred. Musculoskeletal: No clubbing / cyanosis of digits/nails. No joint deformity upper and lower extremities.  Skin: No rashes, lesions, ulcers on limited skin evaluation was obtained. No induration; Warm and dry.  Neurologic: Does not follow commands and Romberg sign and cerebellar reflexes were not assessed  Psychiatric: Impaired judgment and insight. Not Alert and oriented x 3.   Labs on Admission: I have personally reviewed following labs and imaging studies  CBC: Recent Labs  Lab 04/10/19 0150 04/11/19 0340 04/05/2019 1039  WBC 6.6 6.8 13.8*  NEUTROABS 5.5 5.7 11.9*  HGB 12.0* 12.6*  17.2*  HCT 37.7* 39.8 55.9*  MCV 91.3 91.5 93.8  PLT 233 230 270   Basic Metabolic Panel: Recent Labs  Lab 04/10/19 0150 04/11/19 0340 04/12/19 0056 04/13/19 0128 03/30/2019 1039  NA 149* 145 144 144 153*  K 4.3 4.2 4.1 4.5 4.3  CL 113* 109 109 110 117*  CO2 26 26 27 24 23   GLUCOSE 163* 181* 190* 161* 132*  BUN 52* 50* 48* 54* 63*  CREATININE 1.11 1.14 1.11 1.21 1.56*  CALCIUM 9.5 9.6 9.7 9.4 10.2   GFR: Estimated Creatinine Clearance: 32.1 mL/min (A) (by C-G formula based on SCr of 1.56 mg/dL (H)). Liver Function Tests: Recent Labs  Lab 04/10/19 0150 04/11/19 0340 04/20/2019 1039  AST 36 27 34  ALT 37 32 52*  ALKPHOS 45 57 80  BILITOT 0.8 0.8 1.4*  PROT 5.6* 5.8* 6.8  ALBUMIN 2.8* 2.8* 3.4*   No results for input(s): LIPASE, AMYLASE in the last 168 hours. No results for input(s): AMMONIA in the last 168 hours. Coagulation Profile: No results for input(s): INR, PROTIME in the last 168 hours. Cardiac Enzymes: No results for input(s): CKTOTAL, CKMB,  CKMBINDEX, TROPONINI in the last 168 hours. BNP (last 3 results) No results for input(s): PROBNP in the last 8760 hours. HbA1C: No results for input(s): HGBA1C in the last 72 hours. CBG: Recent Labs  Lab 04/11/19 1228 04/15/19 0723  GLUCAP 192* 101*   Lipid Profile: Recent Labs    03/29/2019 1039  TRIG 258*   Thyroid Function Tests: No results for input(s): TSH, T4TOTAL, FREET4, T3FREE, THYROIDAB in the last 72 hours. Anemia Panel: Recent Labs    03/30/2019 1039  FERRITIN 636*   Urine analysis: No results found for: COLORURINE, APPEARANCEUR, LABSPEC, PHURINE, GLUCOSEU, HGBUR, BILIRUBINUR, KETONESUR, PROTEINUR, UROBILINOGEN, NITRITE, LEUKOCYTESUR Sepsis Labs: !!!!!!!!!!!!!!!!!!!!!!!!!!!!!!!!!!!!!!!!!!!! (procalcitonin:4,lacticidven:4) ) Recent Results (from the past 240 hour(s))  Culture, blood (routine x 2)     Status: None   Collection Time: 04/07/19  9:20 AM   Specimen: BLOOD  Result Value Ref  Range Status   Specimen Description   Final    BLOOD RIGHT ANTECUBITAL Performed at [facility], [address] [place], [place] [address]    Special Requests   Final    BOTTLES DRAWN AEROBIC ONLY Blood Culture adequate volume Performed at [facility], [address], [place], [place] [address]    Culture   Final    NO GROWTH 5 DAYS Performed at [facility], [address] [place], [place] [address]    Report Status 04/12/2019 FINAL  Final  Culture, blood (routine x 2)     Status: None   Collection Time: 04/07/19  9:25 AM   Specimen: BLOOD  Result Value Ref Range Status   Specimen Description   Final    BLOOD LEFT ANTECUBITAL Performed at [facility], [address], [place], [place] [address]    Special Requests   Final    BOTTLES DRAWN AEROBIC ONLY Blood Culture adequate volume Performed at [facility], [address] [place], [place] [address]    Culture   Final    NO GROWTH 5 DAYS Performed at [facility], [address] [place], [place] [address]    Report Status 04/12/2019 FINAL  Final  Blood Culture (routine x 2)     Status: None (Preliminary result)   Collection Time: 04/18/2019 10:44 AM   Specimen: Right Antecubital; Blood  Result Value Ref Range Status   Specimen Description   Final    RIGHT ANTECUBITAL Performed at [facility], [address] [place], [place] [address]    Special Requests   Final    BOTTLES DRAWN AEROBIC AND ANAEROBIC Blood Culture adequate volume Performed at [facility], [address] [place], [place] [address]    Culture   Final    NO GROWTH <12 HOURS Performed at [facility], [address] [place], [place] [address]    Report Status PENDING  Incomplete  Blood Culture (routine x 2)     Status: None (Preliminary result)   Collection Time: 03/26/2019 11:12 AM   Specimen: Left Antecubital; Blood    Result Value Ref Range Status   Specimen Description   Final    LEFT ANTECUBITAL Performed at [facility], [address], [place], [place] [address]    Special Requests   Final    BOTTLES DRAWN AEROBIC AND ANAEROBIC Blood Culture adequate volume Performed at [facility], [address], [place], [place] [address]    Culture   Final  NO GROWTH <12 HOURS Performed at [facility], [address] [place], [place] [id number]    Report Status PENDING  Incomplete     Radiological Exams on Admission: DG Chest Port 1 View  Result Date: 04/10/2019 CLINICAL DATA:  Shortness of breath.  COVID positive EXAM: PORTABLE CHEST 1 VIEW COMPARISON:  04/15/2019 FINDINGS: Diffuse interstitial opacities and patchy airspace disease in the lower lungs, similar to prior study. Heart is normal size. Diffuse aortic atherosclerosis. No effusions or acute bony abnormality. IMPRESSION: Diffuse interstitial disease with patchy bilateral airspace opacities most pronounced in the bases, similar to prior study. Aortic atherosclerosis Electronically Signed   By: Charlett NoseKevin  Dover M.D.   On: 04/18/2019 10:58   DG Chest Portable 1 View  Result Date: 04/15/2019 CLINICAL DATA:  85 year old male with shortness of breath. Positive COVID-19. EXAM: PORTABLE CHEST 1 VIEW COMPARISON:  Chest radiograph dated 04/07/2019. FINDINGS: Bilateral peripheral and subpleural densities with interval progression since the radiograph of 04/07/2019. There is also progression of airspace density at the left lung base with silhouetting of the left hemidiaphragm and left cardiac border concerning for worsening pneumonia. Clinical correlation is recommended. No large pleural effusion or pneumothorax. There is atherosclerotic calcification of the aorta. Coronary vascular calcification noted. No acute osseous pathology. IMPRESSION: 1. Worsening bilateral airspace densities with predominant involvement of the  left lung base. Clinical correlation and follow-up recommended. 2. Atherosclerotic calcification of the aorta. Electronically Signed   By: Elgie CollardArash  Radparvar M.D.   On: 04/15/2019 20:11    EKG: Independently reviewed. Showed A Fib with RVR  Assessment/Plan Active Problems:   Failure to thrive in adult   Atrial fibrillation with tachycardic ventricular rate (HCC)  Acute respiratory failure with hypoxia and shortness of breath the setting of recent pneumonia due to COVID-19 disease -Recently diagnosed with COVID-19 disease and stayed hospitalized from December 11 through December 21 and discharged yesterday -had a full treatment and preferred on IV remdesivir, steroids, zinc, vitamin C, he also received Actemra and convalescent plasma on 04/19/2019 -Patient was to continue oral steroids for 3 more days but will give IV dexamethasone for 2 more days as he received 1 dose in the ED -Continue Combivent -Inflammatory Marker Trend - Recent Labs    03/27/2019 1039  DDIMER 2.24*  FERRITIN 636*  LDH 480*  CRP <0.5    No results found for: SARSCOV2NAA  -SpO2: 98 % O2 Flow Rate (L/min): 15 L/min -Discharged on 4 L yesterday and then sent back to the hospital yesterday and then comes back.  Hypoxic given his inability to keep his oxygen on and he is now on nonrebreather saturating 100% -Patient has severe dementia limiting his  A. fib with RVR -Likely new onset -In the setting of hypoxia -Placed on Cardizem drip -Continue heparin drip -Heart rates have been better controlled since starting Cardizem drip -N.p.o. for now as patient is encephalopathic and unable to answer questions or give subjective history -P.o. when appropriate  AKI on CKD stage III -Mild and had some dehydration -Continue IV fluid hydration with normal saline at 75 mils per hour -Avoid nephrotoxic medications, contrast dyes, hypotension and renally adjust medications-strict I's and O's and daily weights next -Continue to  monitor and trend renal function -Repeat CMP in a.m.  Failure to thrive in adult -Needs goals of care discussion and have consulted ID. -Likely may not survive this hospitalization due to his extremely limited and poor status   GOC; DNR, poA  Dementia with recent acute  metabolic encephalopathy on last hospitalization -Likely the cause of his hypoxia as he does not keep his oxygen on -Unable to provide a subjective history and patient is not awake and alert -palliative care to follow and appreciate further evaluation recommendations -Patient is not speaking or following commands -Prognosis is a very poor and limited -He was started on Seroquel and provided with conservative measures -Continue delirium precautions and reorientation  Glaucoma -Resume home medications  Hypernatremia -In the setting of dehydration likely from poor oral intake -IV fluid hydration -Continue to monitor and trend CMP  Hyperbilirubinemia -Mild likely reactive -Continue to monitor trend and repeat CMP in a.m.  Lactic acidosis-in setting of dehydration -Continue IV fluid hydration and lactic acid level is trending downward -Lactic acid level went from 3.7 is now 2.5  DVT prophylaxis: Heparin gtt Code Status: DO NOT RESUSICTATE  Family Communication: No family present at bedside Disposition Plan: Pending GOC and further Hospital Course Consults called: EDP Called Palliative Care Admission status: Inpatient Progressive  Severity of Illness: The appropriate patient status for this patient is INPATIENT. Inpatient status is judged to be reasonable and necessary in order to provide the required intensity of service to ensure the patient's safety. The patient's presenting symptoms, physical exam findings, and initial radiographic and laboratory data in the context of their chronic comorbidities is felt to place them at high risk for further clinical deterioration. Furthermore, it is not anticipated that the  patient will be medically stable for discharge from the hospital within 2 midnights of admission. The following factors support the patient status of inpatient.   " The patient's presenting symptoms include Confusion, and SOB Hypoxia. " The worrisome physical exam findings include diminished breath sounds. " The initial radiographic and laboratory data are worrisome because of Persistent Pneumonia " The chronic co-morbidities are listed as above  * I certify that at the point of admission it is my clinical judgment that the patient will require inpatient hospital care spanning beyond 2 midnights from the point of admission due to high intensity of service, high risk for further deterioration and high frequency of surveillance required.Merlene Laughter, D.O. [facility] PAGER is on AMION  If 7PM-7AM, please contact night-coverage [email or web address]  04/10/2019, 6:06 PM

## 2019-04-16 NOTE — Progress Notes (Signed)
ANTICOAGULATION CONSULT NOTE - Follow Up Consult  Pharmacy Consult for heparin Indication: atrial fibrillation  Allergies  Allergen Reactions  . Penicillins Shortness Of Breath    Did it involve swelling of the face/tongue/throat, SOB, or low BP? Unknown Did it involve sudden or severe rash/hives, skin peeling, or any reaction on the inside of your mouth or nose? Unknown Did you need to seek medical attention at a hospital or doctor's office? Unknown When did it last happen? If all above answers are "NO", may proceed with cephalosporin use.     Patient Measurements: weight 68kg, height 68 inches   Heparin Dosing Weight: 68 kg  Vital Signs: Temp: 98 F (36.7 C) (12/22 1038) Temp Source: Rectal (12/22 1038) BP: 137/91 (12/22 1038) Pulse Rate: 94 (12/22 1038)  Labs: Recent Labs    04/02/2019 1039  HGB 17.2*  HCT 55.9*  PLT 270  CREATININE 1.56*    Estimated Creatinine Clearance: 32.1 mL/min (A) (by C-G formula based on SCr of 1.56 mg/dL (H)).   Assessment: Patient's an 85 y.o M treated at [facility] from 04/05/19 to 04/15/19 for [place].  He was discharged to [facility] place on 12/21 and came back to the ED later that day d/t low sats.  Pharmacy was consulted to start heparin drip on 12/22 for afib.  Of note, he was on lovenox 40 mg subcutaneous q12h for VTE prophylaxis at [place] with last dose given on 12/21 at 0933.   Goal of Therapy:  Heparin level 0.3-0.7 units/ml Monitor platelets by anticoagulation protocol: Yes   Plan:  - heparin 3000 units IV x1 bolus, then drip at 950 units/hr - check 8 hr heparin level - monitor for s/s bleeding  Cody Bradshaw P 04/03/2019,1:06 PM

## 2019-04-17 ENCOUNTER — Inpatient Hospital Stay: Payer: Self-pay

## 2019-04-17 ENCOUNTER — Inpatient Hospital Stay (HOSPITAL_COMMUNITY): Payer: Medicare Other

## 2019-04-17 DIAGNOSIS — J1289 Other viral pneumonia: Secondary | ICD-10-CM

## 2019-04-17 DIAGNOSIS — U071 COVID-19: Principal | ICD-10-CM

## 2019-04-17 DIAGNOSIS — Z515 Encounter for palliative care: Secondary | ICD-10-CM

## 2019-04-17 DIAGNOSIS — Z7189 Other specified counseling: Secondary | ICD-10-CM

## 2019-04-17 DIAGNOSIS — G9341 Metabolic encephalopathy: Secondary | ICD-10-CM

## 2019-04-17 LAB — FIBRINOGEN: Fibrinogen: 324 mg/dL (ref 210–475)

## 2019-04-17 LAB — CBC WITH DIFFERENTIAL/PLATELET
Abs Immature Granulocytes: 0.23 10*3/uL — ABNORMAL HIGH (ref 0.00–0.07)
Basophils Absolute: 0.1 10*3/uL (ref 0.0–0.1)
Basophils Relative: 0 %
Eosinophils Absolute: 0 10*3/uL (ref 0.0–0.5)
Eosinophils Relative: 0 %
HCT: 50.8 % (ref 39.0–52.0)
Hemoglobin: 15.6 g/dL (ref 13.0–17.0)
Immature Granulocytes: 1 %
Lymphocytes Relative: 4 %
Lymphs Abs: 0.7 10*3/uL (ref 0.7–4.0)
MCH: 28.8 pg (ref 26.0–34.0)
MCHC: 30.7 g/dL (ref 30.0–36.0)
MCV: 93.9 fL (ref 80.0–100.0)
Monocytes Absolute: 1.6 10*3/uL — ABNORMAL HIGH (ref 0.1–1.0)
Monocytes Relative: 10 %
Neutro Abs: 13.7 10*3/uL — ABNORMAL HIGH (ref 1.7–7.7)
Neutrophils Relative %: 85 %
Platelets: 230 10*3/uL (ref 150–400)
RBC: 5.41 MIL/uL (ref 4.22–5.81)
RDW: 15.9 % — ABNORMAL HIGH (ref 11.5–15.5)
WBC: 16.3 10*3/uL — ABNORMAL HIGH (ref 4.0–10.5)
nRBC: 0 % (ref 0.0–0.2)

## 2019-04-17 LAB — FERRITIN: Ferritin: 777 ng/mL — ABNORMAL HIGH (ref 24–336)

## 2019-04-17 LAB — COMPREHENSIVE METABOLIC PANEL
ALT: 46 U/L — ABNORMAL HIGH (ref 0–44)
AST: 37 U/L (ref 15–41)
Albumin: 3.3 g/dL — ABNORMAL LOW (ref 3.5–5.0)
Alkaline Phosphatase: 77 U/L (ref 38–126)
Anion gap: 13 (ref 5–15)
BUN: 66 mg/dL — ABNORMAL HIGH (ref 8–23)
CO2: 21 mmol/L — ABNORMAL LOW (ref 22–32)
Calcium: 10.1 mg/dL (ref 8.9–10.3)
Chloride: 121 mmol/L — ABNORMAL HIGH (ref 98–111)
Creatinine, Ser: 1.6 mg/dL — ABNORMAL HIGH (ref 0.61–1.24)
GFR calc Af Amer: 44 mL/min — ABNORMAL LOW (ref >60)
GFR calc non Af Amer: 38 mL/min — ABNORMAL LOW (ref >60)
Glucose, Bld: 125 mg/dL — ABNORMAL HIGH (ref 70–99)
Potassium: 4.2 mmol/L (ref 3.5–5.1)
Sodium: 155 mmol/L — ABNORMAL HIGH (ref 135–145)
Total Bilirubin: 1.6 mg/dL — ABNORMAL HIGH (ref 0.3–1.2)
Total Protein: 6.3 g/dL — ABNORMAL LOW (ref 6.5–8.1)

## 2019-04-17 LAB — PHOSPHORUS: Phosphorus: 3.8 mg/dL (ref 2.5–4.6)

## 2019-04-17 LAB — GLUCOSE, CAPILLARY
Glucose-Capillary: 101 mg/dL — ABNORMAL HIGH (ref 70–99)
Glucose-Capillary: 103 mg/dL — ABNORMAL HIGH (ref 70–99)
Glucose-Capillary: 128 mg/dL — ABNORMAL HIGH (ref 70–99)
Glucose-Capillary: 171 mg/dL — ABNORMAL HIGH (ref 70–99)

## 2019-04-17 LAB — HEPARIN LEVEL (UNFRACTIONATED): Heparin Unfractionated: 0.81 IU/mL — ABNORMAL HIGH (ref 0.30–0.70)

## 2019-04-17 LAB — LACTATE DEHYDROGENASE: LDH: 519 U/L — ABNORMAL HIGH (ref 98–192)

## 2019-04-17 LAB — C-REACTIVE PROTEIN: CRP: 0.5 mg/dL (ref <1.0)

## 2019-04-17 LAB — MAGNESIUM: Magnesium: 2.8 mg/dL — ABNORMAL HIGH (ref 1.7–2.4)

## 2019-04-17 LAB — SEDIMENTATION RATE: Sed Rate: 0 mm/hr (ref 0–16)

## 2019-04-17 LAB — D-DIMER, QUANTITATIVE: D-Dimer, Quant: 1.73 ug/mL-FEU — ABNORMAL HIGH (ref 0.00–0.50)

## 2019-04-17 MED ORDER — OLANZAPINE 10 MG IM SOLR
5.0000 mg | Freq: Two times a day (BID) | INTRAMUSCULAR | Status: DC | PRN
  Administered 2019-04-17 – 2019-04-19 (×3): 5 mg via INTRAMUSCULAR
  Filled 2019-04-17 (×5): qty 10

## 2019-04-17 MED ORDER — HEPARIN (PORCINE) 25000 UT/250ML-% IV SOLN
600.0000 [IU]/h | INTRAVENOUS | Status: DC
  Administered 2019-04-17 – 2019-04-19 (×2): 600 [IU]/h via INTRAVENOUS
  Filled 2019-04-17 (×2): qty 250

## 2019-04-17 MED ORDER — HALOPERIDOL LACTATE 5 MG/ML IJ SOLN: 2.0000 mg | Freq: Four times a day (QID) | INTRAMUSCULAR | Status: DC | PRN

## 2019-04-17 MED ORDER — CHLORHEXIDINE GLUCONATE CLOTH 2 % EX PADS
6.0000 | MEDICATED_PAD | Freq: Every day | CUTANEOUS | Status: DC
  Administered 2019-04-17 – 2019-04-22 (×6): 6 via TOPICAL

## 2019-04-17 MED ORDER — STERILE WATER FOR INJECTION IJ SOLN
INTRAMUSCULAR | Status: AC
  Administered 2019-04-17: 17:00:00 10 mL
  Filled 2019-04-17: qty 10

## 2019-04-17 MED ORDER — SODIUM CHLORIDE 0.9% FLUSH: 10.0000 mL | INTRAVENOUS | Status: DC | PRN

## 2019-04-17 MED ORDER — BISACODYL 10 MG RE SUPP
10.0000 mg | Freq: Every day | RECTAL | Status: DC | PRN
  Filled 2019-04-17: qty 1

## 2019-04-17 MED ORDER — DEXTROSE 5 % IV SOLN: INTRAVENOUS | Status: DC

## 2019-04-17 MED ORDER — MORPHINE SULFATE (PF) 2 MG/ML IV SOLN: 4.0000 mg | INTRAVENOUS | Status: DC | PRN

## 2019-04-17 MED ORDER — BISACODYL 10 MG RE SUPP
10.0000 mg | Freq: Once | RECTAL | Status: AC
  Administered 2019-04-17: 17:00:00 10 mg via RECTAL
  Filled 2019-04-17: qty 1

## 2019-04-17 MED ORDER — CHLORHEXIDINE GLUCONATE CLOTH 2 % EX PADS
6.0000 | MEDICATED_PAD | Freq: Every day | CUTANEOUS | Status: DC
  Administered 2019-04-17 – 2019-04-22 (×5): 6 via TOPICAL

## 2019-04-17 MED ORDER — MORPHINE SULFATE (PF) 2 MG/ML IV SOLN
2.0000 mg | INTRAVENOUS | Status: DC | PRN
  Administered 2019-04-17 – 2019-04-18 (×4): 2 mg via INTRAVENOUS
  Filled 2019-04-17 (×4): qty 1

## 2019-04-17 NOTE — Evaluation (Signed)
Physical Therapy Evaluation Patient Details Name: Cody Bradshaw MRN: [id number] DOB: [birth year] Today's Date: 04/17/2019   History of Present Illness  85 y.o. male with medical history significant for dementia and history of myocardial infarction who was recently admitted to [facility] medical campus for treatment of COVID-19 pneumonia and was discharged on 12/21 but subsequently presented with worsening hypoxia and shortness of breath as well as new onset atrial fibrillation RVR.    Patient was noted to be delirious at presentation.  He was hospitalized for further management.    Clinical Impression   Pt admitted with above diagnosis. Pt has been re-admitted to hospital after failed dc to SNF. Prior to initial hospitalization pt was living home with spouse and was independent with mobility and ADLs, as per chart review. Pt currently with functional limitations due to the deficits listed below (see PT Problem List). This am pt is very lethargic and unable to follow any cues/commands at all, he is needing max-total a with all functional mobility. Pt was only able to tolerate sitting EOB for a few mins and this required max a to maintain, with pt attempting to lay himself back in bed throughout. Pt was on 5L/min via HFNC. Pt will benefit from skilled PT to increase their independence and safety with mobility to allow discharge to the venue listed below.       Follow Up Recommendations SNF;Supervision/Assistance - 24 hour    Equipment Recommendations  None recommended by PT    Recommendations for Other Services       Precautions / Restrictions Precautions Precautions: Fall Restrictions Weight Bearing Restrictions: No      Mobility  Bed Mobility Overal bed mobility: Needs Assistance Bed Mobility: Supine to Sit;Sit to Supine     Supine to sit: +2 for physical assistance;+2 for safety/equipment;Total assist Sit to supine: +2 for safety/equipment;+2 for physical assistance;Max assist    General bed mobility comments: Pt trying to move himself back into bed  Transfers                 General transfer comment: was able to get to EOB with max a x2, pt needs max a to maintain sitting but not able to move past this  Ambulation/Gait             General Gait Details: did not attempt ambulation this session sec to safety concerns  Stairs            Wheelchair Mobility    Modified Rankin (Stroke Patients Only)       Balance Overall balance assessment: Needs assistance Sitting-balance support: Feet unsupported Sitting balance-Leahy Scale: Poor       Standing balance-Leahy Scale: Zero                               Pertinent Vitals/Pain Pain Assessment: Faces Faces Pain Scale: Hurts even more Pain Location: generalized w/ mobility  Pain Descriptors / Indicators: Grimacing;Guarding;Discomfort Pain Intervention(s): Limited activity within patient's tolerance    Home Living Family/patient expects to be discharged to:: Skilled nursing facility Living Arrangements: Spouse/significant other Available Help at Discharge: Family Type of Home: House Home Access: Stairs to enter Entrance Stairs-Rails: Right Entrance Stairs-Number of Steps: 1 Home Layout: One level Home Equipment: Bedside commode;Shower seat Additional Comments: pt was living home with spouse prior to initial hospitalization, he was dc from hospital to SNF and now returns to hospital from SNF. Pt is expected to  dc back to SNF    Prior Function Level of Independence: Needs assistance   Gait / Transfers Assistance Needed: Prior to initial hospitalization pt was independent with mobility and ADLs at home with spouse           Hand Dominance   Dominant Hand: Right    Extremity/Trunk Assessment   Upper Extremity Assessment Upper Extremity Assessment: Generalized weakness    Lower Extremity Assessment Lower Extremity Assessment: Generalized weakness     Cervical / Trunk Assessment Cervical / Trunk Assessment: Kyphotic  Communication   Communication: Expressive difficulties;Receptive difficulties;HOH  Cognition Arousal/Alertness: Lethargic Behavior During Therapy: Agitated;Restless Overall Cognitive Status: Impaired/Different from baseline Area of Impairment: Orientation;Attention;Memory;Following commands;Safety/judgement;Awareness;Problem solving                 Orientation Level: Disoriented to;Person;Place;Time;Situation   Memory: Decreased recall of precautions;Decreased short-term memory Following Commands: (does not follow any commands)     Problem Solving: Slow processing;Decreased initiation;Difficulty sequencing;Requires tactile cues;Requires verbal cues General Comments: Pt not following commands; appesr to be hallucinating at times      General Comments      Exercises     Assessment/Plan    PT Assessment Patient needs continued PT services  PT Problem List Decreased strength;Decreased range of motion;Decreased activity tolerance;Decreased balance;Decreased mobility;Decreased coordination;Decreased safety awareness;Decreased knowledge of use of DME       PT Treatment Interventions DME instruction;Gait training;Functional mobility training;Therapeutic activities;Therapeutic exercise;Balance training;Neuromuscular re-education;Patient/family education    PT Goals (Current goals can be found in the Care Plan section)  Acute Rehab PT Goals Patient Stated Goal: pt did not voice any Time For Goal Achievement: 05/01/19 Potential to Achieve Goals: Fair    Frequency Min 2X/week   Barriers to discharge Other (comment) lived home with spouse    Co-evaluation PT/OT/SLP Co-Evaluation/Treatment: Yes Reason for Co-Treatment: Complexity of the patient's impairments (multi-system involvement);Necessary to address cognition/behavior during functional activity;For patient/therapist safety;To address functional/ADL  transfers PT goals addressed during session: Mobility/safety with mobility;Balance;Proper use of DME;Strengthening/ROM OT goals addressed during session: ADL's and self-care       AM-PAC PT "6 Clicks" Mobility  Outcome Measure Help needed turning from your back to your side while in a flat bed without using bedrails?: Total Help needed moving from lying on your back to sitting on the side of a flat bed without using bedrails?: Total Help needed moving to and from a bed to a chair (including a wheelchair)?: Total Help needed standing up from a chair using your arms (e.g., wheelchair or bedside chair)?: Total Help needed to walk in hospital room?: Total Help needed climbing 3-5 steps with a railing? : Total 6 Click Score: 6    End of Session Equipment Utilized During Treatment: Oxygen Activity Tolerance: Treatment limited secondary to medical complications (Comment);Patient limited by lethargy;Patient limited by fatigue Patient left: in bed;with call bell/phone within reach;with bed alarm set Nurse Communication: Mobility status;Need for lift equipment PT Visit Diagnosis: Other abnormalities of gait and mobility (R26.89);Muscle weakness (generalized) (M62.81)    Time: 1610-9604 PT Time Calculation (min) (ACUTE ONLY): 16 min   Charges:   PT Evaluation $PT Eval High Complexity: 1 High          Horald Chestnut, PT   Delford Field 04/17/2019, 1:50 PM

## 2019-04-17 NOTE — Progress Notes (Signed)
Peripherally Inserted Central Catheter/Midline Placement  The IV Nurse has discussed with the patient and/or persons authorized to consent for the patient, the purpose of this procedure and the potential benefits and risks involved with this procedure.  The benefits include less needle sticks, lab draws from the catheter, and the patient may be discharged home with the catheter. Risks include, but not limited to, infection, bleeding, blood clot (thrombus formation), and puncture of an artery; nerve damage and irregular heartbeat and possibility to perform a PICC exchange if needed/ordered by physician.  Alternatives to this procedure were also discussed.  Bard Power PICC patient education guide, fact sheet on infection prevention and patient information card has been provided to patient /or left at bedside.    PICC/Midline Placement Documentation  PICC Double Lumen 04/17/19 PICC Right Brachial 37 cm 0 cm (Active)  Indication for Insertion or Continuance of Line Prolonged intravenous therapies 04/17/19 1149  Exposed Catheter (cm) 0 cm 04/17/19 1149  Site Assessment Clean;Dry;Intact 04/17/19 1149  Lumen #1 Status Flushed;Blood return noted;Saline locked 04/17/19 1149  Lumen #2 Status Flushed;Blood return noted;Saline locked 04/17/19 1149  Dressing Type Transparent 04/17/19 1149  Dressing Status Clean;Dry;Intact;Antimicrobial disc in place 04/17/19 1149  Dressing Change Due 04/24/19 04/17/19 1149       Cody Bradshaw 04/17/2019, 11:52 AM

## 2019-04-17 NOTE — Progress Notes (Signed)
ANTICOAGULATION CONSULT NOTE - Follow Up Consult  Pharmacy Consult for heparin Indication: atrial fibrillation   Weight 68kg,  Height 68 inches Ideal body weight: 68.4 kg (150 lb 12.7 oz)  Heparin Dosing Weight: 68 kg  Vital Signs: Temp: 97.2 F (36.2 C) (12/23 1216) Temp Source: Axillary (12/23 1216) BP: 127/113 (12/23 1216) Pulse Rate: 47 (12/23 1216)  Labs: Recent Labs    03-May-2019 1039 May 03, 2019 2225 04/17/19 0440 04/17/19 1105  HGB 17.2*  --  15.6  --   HCT 55.9*  --  50.8  --   PLT 270  --  230  --   HEPARINUNFRC  --  1.46*  --  0.81*  CREATININE 1.56*  --  1.60*  --     Estimated Creatinine Clearance: 31.3 mL/min (A) (by C-G formula based on SCr of 1.6 mg/dL (H)).   Assessment: Patient's an 85 y.o M treated at [facility] from 04/05/19 to 04/15/19 for [place].  He was discharged to [facility] place on 12/21 and came back to the ED later that day d/t low sats.  Pharmacy was consulted to start heparin drip on 12/22 for afib.  Heparin level this morning remains SUPRAtherapeutic though trending down after a rate decrease last night (HL 0.81 << 1.46, goal of 0.3-0.7). CBC wnl - no issues or bleeding per discussion with RN.  Goal of Therapy:  Heparin level 0.3-0.7 units/ml Monitor platelets by anticoagulation protocol: Yes   Plan:  - Decrease Heparin to 600 units/hr (6 ml/hr) - Will continue to monitor for any signs/symptoms of bleeding and will follow up with heparin level in 8 hours   Thank you for allowing pharmacy to be a part of this patient's care.  Alycia Rossetti, PharmD, BCPS Clinical Pharmacist 04/17/2019 12:39 PM   **Pharmacist phone directory can now be found on [email or web address] ([email or web address]).  Listed under [place].

## 2019-04-17 NOTE — Consult Note (Signed)
Consultation Note Date: 04/17/2019   Patient Name: Cody Bradshaw  DOB: 09/02/1929  MRN:  [id number]  Age / Sex:  85 y.o., male  [facility]P: [facility] Referring Physician: Osvaldo Bradshaw, Gokul, MD  Reason for Consultation: Goals Of Care  HPI/Patient Profile:  Per Hospitalist H&P --> Cody Bradshaw is a 85 y.o. male with medical history significant for dementia and history of myocardial infarction who was recently discharged [facility] for treatment of COVID-19 pneumonia and was discharged yesterday but subsequently presented today with worsening hypoxia and shortness of breath as well as new onset atrial fibrillation RVR.  Of note patient is unable to provide a subjective history to his current condition and does not follow commands or answer questions and he was extremely agitated and was given lorazepam I saw him and is now somnolent.  No family is currently at bedside answer any questions and mostly history is obtained from the EDP and the medical chart. From his skilled nursing facility for shortness of breath and hypoxia and he was in the rehab facility for less than a whole day.  The facility reported that he was desaturating on his nasal cannula but he was discharged on a little more short of breath.  TR[facility] was asked to admit this patient for acute respiratory failure with hypoxia and failure to thrive in a patient that there is a positive for Covid and treated for Covid recently.  Clinical Assessment and Goals of Care: Completed chart review.  Spoke to patient nurse, Cody Bradshaw who said that the patient is agitated at the present time.  She said from a cognitive perspective that he has been altered and pulling at his lines. He is NPO presently.   Called patients wife, Cody Bradshaw to gain collateral information. Prior to hospital stay he was mowing his lawn. His wife noticed he was more lethargic and  eating less than normal. The night of hospitalization patient had gotten up to the restroom and fallen on the bed requiring EMS to be called.   At baseline the patient has poor hearing, with mask on it makes it more difficult. Patient was able to dress himself, bath himself, and feed himself prior to hospitalization.   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 He has three children a daughter Cody Bradshaw (4162 yrs), daughter Cody Bradshaw (4851 yrs), and son (65 yrs). Have six grandchildren and 11 grandchildren.   Per patients wife it has been very difficult with him being hospitalized due to poor communication. On Monday he was [facility], fell then [facility] then [facility] then was hypoxic and [facility].   We discussed patients health deterioration over these last two weeks. Also emphasized that COVID-19 is affecting all people differently. Shared that we are seeing failure to thrive in many geriatric patients some improve and some plateau per observation. She vocalized that the hope  is for improvement though she is realistic that if he does not start to turn around emphasis on more of a comfort avenue would be congruent with what the patient would want for himself.     We discussed hospice and what it would look like in [name] case. Per [place] she would wish for him to go to a hospice home if that were needed. She vocalized living down the street from one, [facility].   We discussed his possible improvement that the patient could go to a rehabilitation closer to their home as well. She will not consent to him going back to [facility] place.   Verified that patient is DNAR/DNI, would not want feeding tubes.   Patient Representative: Cody Bradshaw - [phone]  SUMMARY OF RECOMMENDATIONS   DNAR/ DNI No Tube  Feedings Chaplain consult Delirium Precautions Constipation treatment  Code Status/Advance Care Planning:  DNR   Symptom Management:   Constipation:  Bisacodyl 10mg  PR x1, and then PRN daily   Metabolic Encephalopathy:  Delirium Precautions  DC Haldol  Olanzapine 5mg  IM BID PRN  Palliative Prophylaxis:   Aspiration, Bowel Regimen, Delirium Protocol, Frequent Pain Assessment, Oral Care and Palliative Wound Care  Additional Recommendations (Limitations, Scope, Preferences):  Minimize Medications and No Artificial Feeding  Psycho-social/Spiritual:   Desire for further Chaplaincy support:yes  Additional Recommendations: Caregiving  Support/Resources and Education on Hospice  Prognosis:   < 6 months  Discharge Planning: [place] for rehab with Palliative care service follow-up      Primary Diagnoses: Present on Admission: . Failure to thrive in adult . Atrial fibrillation with tachycardic ventricular rate ([place])  I have reviewed the medical record, interviewed the patient and family, and examined the patient. The following aspects are pertinent.  Past Medical History:  Diagnosis Date  . Dementia ([place])   . GERD (gastroesophageal reflux disease)   . Hypertension   . Lab test positive for detection of COVID-19 virus   . MI (myocardial infarction) ([place])   Oldest Sister died of alzheimer's Brother died of  Father died in a truck accident Mother died of pancreatic cancer  Social History   Socioeconomic History  . Marital status: Married    Spouse name: Cody Bradshaw  . Number of children: Not on file  . Years of education: Not on file  . Highest education level: Not on file  Occupational History  . Occupation: retired  Tobacco Use  . Smoking status: Never Smoker  . Smokeless tobacco: Never Used  Substance and Sexual Activity  . Alcohol use: Not Currently  . Drug use: Not Currently  . Sexual activity: Not Currently  Other  Topics Concern  . Not on file  Social History Narrative  . Not on file   Social Determinants of Health   Financial Resource Strain:   . Difficulty of Paying Living Expenses: Not on file  Food Insecurity:   . Worried About Charity fundraiser in the Last Year: Not on file  . Ran Out of Food in the Last Year: Not on file  Transportation Needs:   . Lack of Transportation (Medical): Not on file  . Lack of Transportation (Non-Medical): Not on file  Physical Activity:   . Days of Exercise per Week: Not on file  . Minutes of Exercise per Session: Not on file  Stress:   . Feeling of Stress : Not on file  Social Connections:   . Frequency of Communication with Friends and Family: Not on file  .  Frequency of Social Gatherings with Friends and Family: Not on file  . Attends Religious Services: Not on file  . Active Member of Clubs or Organizations: Not on file  . Attends Banker Meetings: Not on file  . Marital Status: Not on file  Married - 23 years daughter Cody Pander (40 yrs) daughter Cody Hensen (60 yrs) son (65 yrs) Have six grandchildren and 11 grandchildren  Have a vacation place at [employer]  Enjoys fishing and boating  No family history on file. Scheduled Meds: . brimonidine  1 drop Both Eyes TID  . brinzolamide  1 drop Both Eyes TID  . Chlorhexidine Gluconate Cloth  6 each Topical Daily  . Chlorhexidine Gluconate Cloth  6 each Topical Daily  . insulin aspart  0-5 Units Subcutaneous QHS  . insulin aspart  0-9 Units Subcutaneous TID WC  . Ipratropium-Albuterol  1 puff Inhalation Q6H  . latanoprost  1 drop Both Eyes QHS   Continuous Infusions: . dextrose 75 mL/hr at 04/17/19 1250  . diltiazem (CARDIZEM) infusion 10 mg/hr (04/17/19 0133)  . heparin 600 Units/hr (04/17/19 1251)   PRN Meds:.acetaminophen **OR** acetaminophen, haloperidol lactate, ondansetron **OR** ondansetron (ZOFRAN) IV, polyethylene glycol, polyvinyl alcohol, sodium chloride flush Medications  Prior to Admission:  Prior to Admission medications   Medication Sig Start Date End Date Taking? Authorizing Provider  acetaminophen (TYLENOL) 500 MG tablet Take 1,000 mg by mouth every 6 (six) hours as needed for fever.   Yes [provider]  amLODipine (NORVASC) 2.5 MG tablet Take 2.5 mg by mouth daily.   Yes [provider]  Brinzolamide-Brimonidine [facility] OP) Place 1 drop into both eyes 3 (three) times daily.   Yes [provider]  lisinopril-hydrochlorothiazide (ZESTORETIC) 20-12.5 MG tablet Take 1 tablet by mouth daily.   Yes [provider]  memantine (NAMENDA) 10 MG tablet Take 10 mg by mouth 2 (two) times daily.   Yes [provider]  timolol (TIMOPTIC-XR) 0.5 % ophthalmic gel-forming Place 1 drop into both eyes 2 (two) times daily.   Yes [provider]  carboxymethylcellulose (REFRESH PLUS) 0.5 % SOLN Place 1 drop into both eyes 2 (two) times daily as needed (dry eyes).    [provider]  latanoprost (XALATAN) 0.005 % ophthalmic solution Place 1 drop into both eyes at bedtime.    [provider]   Allergies  Allergen Reactions  . Penicillins Shortness Of Breath    Did it involve swelling of the face/tongue/throat, SOB, or low BP? Unknown Did it involve sudden or severe rash/hives, skin peeling, or any reaction on the inside of your mouth or nose? Unknown Did you need to seek medical attention at a hospital or doctor's office? Unknown When did it last happen? If all above answers are "NO", may proceed with cephalosporin use.    Review of Systems  Physical Exam  Vital Signs: BP (!) 127/113 (BP Location: Left Arm)   Pulse (!) 47   Temp (!) 97.2 F (36.2 C) (Axillary)   Resp 15   SpO2 92%  Pain Scale: PAINAD   Pain Score: 0-No pain   SpO2: SpO2: 92 % O2 Device:SpO2: 92 % O2 Flow Rate: .O2 Flow Rate (L/min): 5 L/min  IO: Intake/output summary: No intake or output data in the 24 hours  ending 04/17/19 1330  Time In: 1400 Time Out: 94174 Time Total: 75 Greater than 50%  of this time was spent counseling and coordinating care related to the above assessment and plan.  Signed by: Ernie Avena, NP   Please contact Palliative Medicine Team phone at [phone] for questions and concerns.  For individual provider: See Lyndle Herrlich, NP Palliative Medicine Team Pager [phone] (Please see [email or web address] for schedule) Team Phone [phone]   The above conversation was completed via telephone due to the visitor restrictions during the COVID-19 pandemic. Thorough chart review and discussion with necessary members of the care team was completed as part of assessment. All issues were discussed and addressed but no physical exam was performed.

## 2019-04-17 NOTE — Progress Notes (Signed)
Occupational Therapy Evaluation Patient Details Name: Cody Bradshaw MRN: [id number] DOB: January 09, 1930 Today's Date: 04/17/2019    History of Present Illness 85 y.o. male with medical history significant for dementia and history of myocardial infarction who was recently admitted to [place] for treatment of COVID-19 pneumonia and was discharged on 12/21 but subsequently presented with worsening hypoxia and shortness of breath as well as new onset atrial fibrillation RVR.    Patient was noted to be delirious at presentation.  He was hospitalized for further management.     Clinical Impression   Prior to getting sick with covid, pt lived at home with his wife and was able to ambulate without an assistive device and completed his own ADL. Pt demonstrates significant functional decline since discharging to SNF and is now total A with all ADL and mobility to progress to EOB. Pt lethargic and nonverbal this session, per chart had Haldol yesterday. Message sent o Dr [place] Pink regarding NPO status and Speech Therapy consult to further assess swallow and need for alternative nutritional means. Will follow acutely as appropriate. Anticipate DC back to SNF.     Follow Up Recommendations  SNF;Supervision/Assistance - 24 hour    Equipment Recommendations  None recommended by OT    Recommendations for Other Services Speech consult     Precautions / Restrictions Precautions Precautions: Fall      Mobility Bed Mobility Overal bed mobility: Needs Assistance Bed Mobility: Supine to Sit;Sit to Supine     Supine to sit: +2 for physical assistance;Total assist(bed pad utilized for mobility) Sit to supine: Max assist;+2 for physical assistance   General bed mobility comments: Pt trying to move himself back into bed  Transfers                 General transfer comment: not attempted    Balance     Sitting balance-Leahy Scale: Zero                                     ADL either performed or assessed with clinical judgement   ADL Overall ADL's : Needs assistance/impaired Eating/Feeding: NPO                                     General ADL Comments: total A at this time     Vision   Additional Comments: unsure     Perception     Praxis      Pertinent Vitals/Pain Pain Assessment: Faces Faces Pain Scale: Hurts little more Pain Location: Generalized Pain Descriptors / Indicators: Discomfort;Grimacing Pain Intervention(s): Limited activity within patient's tolerance     Hand Dominance     Extremity/Trunk Assessment Upper Extremity Assessment Upper Extremity Assessment: Generalized weakness(moving BUE spontaneously. Able to reach mouth wiht B hands)   Lower Extremity Assessment Lower Extremity Assessment: Defer to PT evaluation   Cervical / Trunk Assessment Cervical / Trunk Assessment: Kyphotic   Communication Communication Communication: HOH(did not verbalize during session)   Cognition Arousal/Alertness: Lethargic Behavior During Therapy: Flat affect Overall Cognitive Status: Impaired/Different from baseline Area of Impairment: Orientation;Attention;Memory;Following commands;Safety/judgement;Awareness;Problem solving                               General Comments: Pt not following commands; appesr to be hallucinating at  times   General Comments       Exercises     Shoulder Instructions      Home Living Family/patient expects to be discharged to:: Skilled nursing facility                                 Additional Comments: pt admitted from [facility] but before becoming sick with Covid was at [facility] with wife      Prior Functioning/Environment Level of Independence: Needs assistance  Gait / Transfers Assistance Needed: Pt denies use of RW. Daughter reports pt independent with mobility and ADLs              OT Problem List: Decreased strength;Decreased range of  motion;Decreased activity tolerance;Impaired balance (sitting and/or standing);Decreased coordination;Decreased cognition;Decreased safety awareness;Decreased knowledge of use of DME or AE;Cardiopulmonary status limiting activity;Pain      OT Treatment/Interventions: Self-care/ADL training;Therapeutic exercise;Neuromuscular education;DME and/or AE instruction;Therapeutic activities;Cognitive remediation/compensation;Visual/perceptual remediation/compensation;Patient/family education;Balance training    OT Goals(Current goals can be found in the care plan section) Acute Rehab OT Goals Patient Stated Goal: pt unable to state OT Goal Formulation: Patient unable to participate in goal setting Time For Goal Achievement: 05/01/19 Potential to Achieve Goals: Fair  OT Frequency: Min 2X/week   Barriers to D/C:            Co-evaluation PT/OT/SLP Co-Evaluation/Treatment: Yes Reason for Co-Treatment: Complexity of the patient's impairments (multi-system involvement);Necessary to address cognition/behavior during functional activity;To address functional/ADL transfers   OT goals addressed during session: ADL's and self-care      AM-PAC OT "6 Clicks" Daily Activity     Outcome Measure Help from another person eating meals?: Total Help from another person taking care of personal grooming?: Total Help from another person toileting, which includes using toliet, bedpan, or urinal?: Total Help from another person bathing (including washing, rinsing, drying)?: Total Help from another person to put on and taking off regular upper body clothing?: Total Help from another person to put on and taking off regular lower body clothing?: Total 6 Click Score: 6   End of Session Nurse Communication: Mobility status;Other (comment)(NPO status)  Activity Tolerance: Patient limited by lethargy;Patient limited by fatigue Patient left: in bed;with call bell/phone within reach;with bed alarm set;with restraints  reapplied;with nursing/sitter in room  OT Visit Diagnosis: Other abnormalities of gait and mobility (R26.89);Muscle weakness (generalized) (M62.81);Other symptoms and signs involving cognitive function                Time: 0263-7858 OT Time Calculation (min): 17 min Charges:  OT General Charges $OT Visit: 1 Visit OT Evaluation $OT Eval Moderate Complexity: 1 Mod  Matheo Rathbone, OT/L   Acute OT Clinical Specialist Acute Rehabilitation Services Pager [phone] Office [phone]   [facility] 04/17/2019, 12:46 PM

## 2019-04-17 NOTE — Progress Notes (Signed)
PROGRESS NOTE  Cody Bradshaw JOI:[id number] DOB: 05-13-1929 DOA: 04/04/2019  PCP: [facility]  Brief History/Interval Summary: 85 y.o. male with medical history significant for dementia and history of myocardial infarction who was recently admitted to [facility] medical campus for treatment of COVID-19 pneumonia and was discharged on 12/21 but subsequently presented with worsening hypoxia and shortness of breath as well as new onset atrial fibrillation RVR.    Patient was noted to be delirious at presentation.  He was hospitalized for further management.    Reason for Visit: Acute respiratory failure with hypoxia.  Pneumonia due to COVID-19.  Atrial fibrillation with RVR.  Consultants: Palliative medicine  Procedures: PICC line placement 12/23  Antibiotics: Anti-infectives (From admission, onward)   None      Subjective/Interval History: Patient noted to be delirious this morning.  Unable to answer any questions.   Assessment/Plan:  Acute Hypoxic Resp. Failure/Pneumonia due to COVID-19  COVID-19 Labs  Recent Labs  Lab 04/10/19 1532 04/11/19 0340 04/12/19 0056 03/30/2019 1039 04/17/19 0440  DDIMER 7.64* 7.08* 6.39* 2.24* 1.73*  FERRITIN  --  435*  --  636* 777*  CRP  --  1.3*  --  <0.5 0.5  ALT  --  32  --  52* 46*  PROCALCITON  --   --   --  <0.10  --     Objective findings: Fever: Noted to be afebrile Oxygen requirements: On high flow nasal cannula at 5 to 10 L/min.  Saturating in the mid 90s.  COVID 19 Therapeutics: Antibacterials: None Remdesivir: Completed course of remdesivir recently Steroids: Noted to be on dexamethasone 10 mg every 24 hours Diuretics: Not on diuretics on a scheduled basis Actemra: Given during previous hospitalization Convalescent Plasma: Given during previous hospitalization DVT Prophylaxis: Currently on heparin infusion due to A. fib  Patient was discharged on 4 L of oxygen. Patient requiring anywhere between 5 to 10 L of  oxygen by high flow nasal cannula.  Some of the low oxygen saturation could be related to poor readings due to his delirium.  His x-ray done last night did show bilateral infiltrates but without any significant change from before.  Patient has been started back on steroids.  Will stop the steroid as his CRP is normal and steroids could be contributing to his encephalopathy.  Patient has completed course of remdesivir.  He was also given Actemra and convalescent plasma during previous hospitalization.  Prognosis is guarded at this time.  Palliative medicine has been consulted.   Atrial fibrillation with RVR This appears to be a new diagnosis.  Currently on diltiazem infusion.  Also on heparin infusion.  Continue for now.  TSH 0.372.  Free T4 1.57.  Free T4 was 1.87 about 10 days ago.  Will not change treatments based on these levels.  Will recommend that these be checked in a few weeks time.  No echocardiogram noted in our system.  We may consider doing it in the next few days depending on his clinical status.  Acute metabolic encephalopathy in the setting of dementia/delirium This is all secondary to Covid.  No obvious focal neurological deficits noted.  Steroids could be contributing.  Hypernatremia could also be contributing.  We will discontinue steroids.  Prognosis is guarded at this time.  Acute kidney injury on chronic kidney disease stage III Dehydration due to poor oral intake is contributing.  Patient started on IV fluids which will be continued.  Change to D5 due to hyponatremia.  Monitor volume  status closely.  Monitor urine output.  History of glaucoma Continue home medications.  Timolol eyedrops was held during previous hospitalization due to significant bradycardia.  Will hold again.  Hypernatremia This is secondary to dehydration from poor oral intake.  D5 water.  Hyperbilirubinemia This is mild.  Most likely due to acute illness.  Continue to monitor periodically.  Lactic  Acidosis Elevated lactic acid level noted at admission at 3.5.  Most likely due to hypovolemia.  Improved with IV hydration.    DVT Prophylaxis: On IV heparin Code Status: DNR Family Communication: We will discuss with family Disposition Plan: Management as outlined above.  Prognosis is guarded.   Medications:  Scheduled: . brimonidine  1 drop Both Eyes TID  . brinzolamide  1 drop Both Eyes TID  . Chlorhexidine Gluconate Cloth  6 each Topical Daily  . Chlorhexidine Gluconate Cloth  6 each Topical Daily  . dexamethasone (DECADRON) injection  10 mg Intravenous Q24H  . insulin aspart  0-5 Units Subcutaneous QHS  . insulin aspart  0-9 Units Subcutaneous TID WC  . Ipratropium-Albuterol  1 puff Inhalation Q6H  . latanoprost  1 drop Both Eyes QHS  . timolol  1 drop Both Eyes BID   Continuous: . sodium chloride    . diltiazem (CARDIZEM) infusion 10 mg/hr (04/17/19 0133)  . heparin 700 Units/hr (04/17/19 0114)   RWE:[id number] **OR** acetaminophen, ondansetron **OR** ondansetron (ZOFRAN) IV, polyethylene glycol, polyvinyl alcohol, sodium chloride flush   Objective:  Vital Signs  Vitals:   04/17/19 0400 04/17/19 0500 04/17/19 0600 04/17/19 0746  BP: (!) 145/85 (!) 152/131 (!) 117/53 (!) 152/68  Pulse: (!) 124 (!) 119 (!) 51 60  Resp: (!) 21 18 19 20   Temp:    (!) 97.3 F (36.3 C)  TempSrc:    Axillary  SpO2: 90% 95% 99% 98%   No intake or output data in the 24 hours ending 04/17/19 1209 There were no vitals filed for this visit.  General appearance: Noted to be delirious. Resp: Tachypneic at rest.  Coarse breath sound bilaterally.  No wheezing or rhonchi.  Crackles at bilateral bases. Cardio: S1-S2 is irregularly irregular.  Tachycardic.   GI: Abdomen is soft.  Nontender nondistended.  Bowel sounds are present normal.  No masses organomegaly Extremities: No edema.  Noted to be moving all his extremities. Neurologic: Delirious.  Moving all his extremities.  No obvious  focal deficits.   Lab Results:  Data Reviewed: I have personally reviewed following labs and imaging studies  CBC: Recent Labs  Lab 04/11/19 0340 04/02/2019 1039 04/17/19 0440  WBC 6.8 13.8* 16.3*  NEUTROABS 5.7 11.9* 13.7*  HGB 12.6* 17.2* 15.6  HCT 39.8 55.9* 50.8  MCV 91.5 93.8 93.9  PLT 230 270 093    Basic Metabolic Panel: Recent Labs  Lab 04/11/19 0340 04/12/19 0056 04/13/19 0128 04/10/2019 1039 04/17/19 0440  NA 145 144 144 153* 155*  K 4.2 4.1 4.5 4.3 4.2  CL 109 109 110 117* 121*  CO2 26 27 24 23  21*  GLUCOSE 181* 190* 161* 132* 125*  BUN 50* 48* 54* 63* 66*  CREATININE 1.14 1.11 1.21 1.56* 1.60*  CALCIUM 9.6 9.7 9.4 10.2 10.1  MG  --   --   --  3.2* 2.8*  PHOS  --   --   --  3.7 3.8    GFR: Estimated Creatinine Clearance: 31.3 mL/min (A) (by C-G formula based on SCr of 1.6 mg/dL (H)).  Liver Function Tests: Recent Labs  Lab 04/11/19 0340 04/10/2019 1039 04/17/19 0440  AST 27 34 37  ALT 32 52* 46*  ALKPHOS 57 80 77  BILITOT 0.8 1.4* 1.6*  PROT 5.8* 6.8 6.3*  ALBUMIN 2.8* 3.4* 3.3*    CBG: Recent Labs  Lab 04/11/19 1228 04/15/19 0723 04/11/2019 2150 04/17/19 0744  GLUCAP 192* 101* 135* 101*    Lipid Profile: Recent Labs    04/19/2019 1039  TRIG 258*    Thyroid Function Tests: Recent Labs    04/07/2019 1039  TSH 0.372  FREET4 1.57*    Anemia Panel: Recent Labs    03/28/2019 1039 04/17/19 0440  FERRITIN 636* 777*    Recent Results (from the past 240 hour(s))  Blood Culture (routine x 2)     Status: None (Preliminary result)   Collection Time: 04/07/2019 10:44 AM   Specimen: BLOOD  Result Value Ref Range Status   Specimen Description   Final    BLOOD RIGHT ANTECUBITAL Performed at [facility], [address] [place], [place] [address]    Special Requests   Final    BOTTLES DRAWN AEROBIC AND ANAEROBIC Blood Culture adequate volume Performed at [facility], [address] [place], [place] [address]     Culture   Final    NO GROWTH < 24 HOURS Performed at [facility], [address] [place], [place] [address]    Report Status PENDING  Incomplete  Blood Culture (routine x 2)     Status: None (Preliminary result)   Collection Time: 04/08/2019 11:12 AM   Specimen: BLOOD  Result Value Ref Range Status   Specimen Description   Final    BLOOD LEFT ANTECUBITAL Performed at [facility], [address] [place], [place] [address]    Special Requests   Final    BOTTLES DRAWN AEROBIC AND ANAEROBIC Blood Culture adequate volume Performed at [facility], [address] [place], [place] [address]    Culture   Final    NO GROWTH < 24 HOURS Performed at [facility], [address] [place], [place] [address]    Report Status PENDING  Incomplete      Radiology Studies: DG Chest Port 1 View  Result Date: 04/17/2019 CLINICAL DATA:  COVID, shortness of breath EXAM: PORTABLE CHEST 1 VIEW COMPARISON:  04/25/2019 FINDINGS: Cardiomegaly. Diffuse aortic atherosclerosis. Diffuse interstitial and airspace opacities, left slightly greater than right. No significant change since prior study. IMPRESSION: Diffuse bilateral interstitial and alveolar opacities, left slightly greater than right. No real change. Electronically Signed   By: Charlett NoseKevin  Dover M.D.   On: 04/17/2019 08:44   DG Chest Port 1 View  Result Date: 04/09/2019 CLINICAL DATA:  Shortness of breath.  COVID positive EXAM: PORTABLE CHEST 1 VIEW COMPARISON:  04/15/2019 FINDINGS: Diffuse interstitial opacities and patchy airspace disease in the lower lungs, similar to prior study. Heart is normal size. Diffuse aortic atherosclerosis. No effusions or acute bony abnormality. IMPRESSION: Diffuse interstitial disease with patchy bilateral airspace opacities most pronounced in the bases, similar to prior study. Aortic atherosclerosis Electronically Signed   By: Charlett NoseKevin  Dover M.D.   On: 04/24/2019 10:58   DG Chest  Portable 1 View  Result Date: 04/15/2019 CLINICAL DATA:  85 year old male with shortness of breath. Positive COVID-19. EXAM: PORTABLE CHEST 1 VIEW COMPARISON:  Chest radiograph dated 04/07/2019. FINDINGS: Bilateral peripheral and subpleural densities with interval progression since the radiograph of 04/07/2019. There is also progression of airspace  density at the left lung base with silhouetting of the left hemidiaphragm and left cardiac border concerning for worsening pneumonia. Clinical correlation is recommended. No large pleural effusion or pneumothorax. There is atherosclerotic calcification of the aorta. Coronary vascular calcification noted. No acute osseous pathology. IMPRESSION: 1. Worsening bilateral airspace densities with predominant involvement of the left lung base. Clinical correlation and follow-up recommended. 2. Atherosclerotic calcification of the aorta. Electronically Signed   By: Elgie Collard M.D.   On: 04/15/2019 20:11   [place] EKG SITE RITE  Result Date: 04/17/2019 If Site Rite image not attached, placement could not be confirmed due to current cardiac rhythm.      LOS: 1 day   Pratham Cassatt [employer] on [email or web address]  04/17/2019, 12:09 PM

## 2019-04-17 NOTE — TOC Initial Note (Signed)
Transition of Care [facility]) - Initial/Assessment Note    Patient Details  Name: Cody Bradshaw MRN: [id number] Date of Birth: Jul 13, 1929  [facility] CM/SW Contact:    Elliot Gault, LCSW Phone Number: 04/17/2019, 9:20 AM  Clinical Narrative:                  Pt admitted from [facility]. Pt was discharged from [facility] to [place] on 12/21. He has returned to [facility] ED two times since the [facility] dc. He was not admitted after the first return to the ED. Prior to pt's COVID diagnosis, he lived at home with his wife. He does have a history of dementia but was reportedly fairly independent in ADLs at that time.  Pt currently has a sitter due to agitation. Notes indicate that Cody Bradshaw reported to ED staff that they could not meet his needs.   Palliative consult is ordered. TOC will follow up once this consult is complete and goals of care are established.  Expected Discharge Plan: (To be determined) Barriers to Discharge: Continued Medical Work up   Patient Goals and CMS Choice        Expected Discharge Plan and Services Expected Discharge Plan: (To be determined) In-house Referral: Clinical Social Work     Living arrangements for the past 2 months: Skilled Holiday representative, Single Family Home                                      Prior Living Arrangements/Services Living arrangements for the past 2 months: Skilled Nursing Facility, Single Family Home Lives with:: Spouse Patient language and need for interpreter reviewed:: Yes        Need for Family Participation in Patient Care: Yes (Comment) Care giver support system in place?: Yes (comment)   Criminal Activity/Legal Involvement Pertinent to Current Situation/Hospitalization: No - Comment as needed  Activities of Daily Living Home Assistive Devices/Equipment: Hospital bed, Grab bars around toilet, Grab bars in shower, Hand-held shower hose, Blood pressure cuff, Scales(Cody Bradshaw health has necessary equipmentfor their  residents) ADL Screening (condition at time of admission) Patient's cognitive ability adequate to safely complete daily activities?: No(patient has dementia) Is the patient deaf or have difficulty hearing?: No Does the patient have difficulty seeing, even when wearing glasses/contacts?: Yes Does the patient have difficulty concentrating, remembering, or making decisions?: Yes Patient able to express need for assistance with ADLs?: No Does the patient have difficulty dressing or bathing?: Yes Independently performs ADLs?: No Communication: Independent Dressing (OT): Needs assistance Is this a change from baseline?: Pre-admission baseline Grooming: Needs assistance Is this a change from baseline?: Pre-admission baseline Feeding: Needs assistance Is this a change from baseline?: Pre-admission baseline Bathing: Needs assistance Is this a change from baseline?: Pre-admission baseline Toileting: Needs assistance Is this a change from baseline?: Pre-admission baseline In/Out Bed: Needs assistance Is this a change from baseline?: Pre-admission baseline Walks in Home: Needs assistance Is this a change from baseline?: Pre-admission baseline Does the patient have difficulty walking or climbing stairs?: Yes Weakness of Legs: Both Weakness of Arms/Hands: None  Permission Sought/Granted                  Emotional Assessment       Orientation: : Oriented to Self Alcohol / Substance Use: Not Applicable Psych Involvement: No (comment)  Admission diagnosis:  Failure to thrive in adult [R62.7] Acute respiratory failure with hypoxia (HCC) [J96.01]  Atrial fibrillation with RVR (HCC) [I48.91] Atrial fibrillation with tachycardic ventricular rate ([place]) [I48.91] COVID-19 virus infection [U07.1] Patient Active Problem List   Diagnosis Date Noted  . Failure to thrive in adult 04/09/2019  . Atrial fibrillation with tachycardic ventricular rate ([place]) 04/09/2019  . Pneumonia due to COVID-19  virus 04/10/2019  . Acute respiratory failure with hypoxia ([place]) 04/06/2019  . Bradycardia 04/06/2019  . Hypertension   . GERD (gastroesophageal reflux disease)   . Dementia [facility])    PCP:  [place]:   [place], [place] [place] [address] Phone: [phone] Fax: [phone] - [place], [place] [address] Phone: [phone] Fax: [phone]     Social Determinants of Health (SDOH) Interventions    Readmission Risk Interventions No flowsheet data found.

## 2019-04-18 LAB — HEPARIN LEVEL (UNFRACTIONATED)
Heparin Unfractionated: 0.46 IU/mL (ref 0.30–0.70)
Heparin Unfractionated: 0.45 IU/mL (ref 0.30–0.70)

## 2019-04-18 LAB — COMPREHENSIVE METABOLIC PANEL
ALT: 40 U/L (ref 0–44)
AST: 29 U/L (ref 15–41)
Albumin: 2.8 g/dL — ABNORMAL LOW (ref 3.5–5.0)
Alkaline Phosphatase: 59 U/L (ref 38–126)
Anion gap: 7 (ref 5–15)
BUN: 61 mg/dL — ABNORMAL HIGH (ref 8–23)
CO2: 22 mmol/L (ref 22–32)
Calcium: 9.5 mg/dL (ref 8.9–10.3)
Chloride: 124 mmol/L — ABNORMAL HIGH (ref 98–111)
Creatinine, Ser: 1.47 mg/dL — ABNORMAL HIGH (ref 0.61–1.24)
GFR calc Af Amer: 49 mL/min — ABNORMAL LOW (ref >60)
GFR calc non Af Amer: 42 mL/min — ABNORMAL LOW (ref >60)
Glucose, Bld: 211 mg/dL — ABNORMAL HIGH (ref 70–99)
Potassium: 4.2 mmol/L (ref 3.5–5.1)
Sodium: 153 mmol/L — ABNORMAL HIGH (ref 135–145)
Total Bilirubin: 1.7 mg/dL — ABNORMAL HIGH (ref 0.3–1.2)
Total Protein: 5.2 g/dL — ABNORMAL LOW (ref 6.5–8.1)

## 2019-04-18 LAB — D-DIMER, QUANTITATIVE: D-Dimer, Quant: 1.15 ug/mL-FEU — ABNORMAL HIGH (ref 0.00–0.50)

## 2019-04-18 LAB — GLUCOSE, CAPILLARY
Glucose-Capillary: 162 mg/dL — ABNORMAL HIGH (ref 70–99)
Glucose-Capillary: 131 mg/dL — ABNORMAL HIGH (ref 70–99)
Glucose-Capillary: 105 mg/dL — ABNORMAL HIGH (ref 70–99)
Glucose-Capillary: 103 mg/dL — ABNORMAL HIGH (ref 70–99)

## 2019-04-18 LAB — CBC
HCT: 39.8 % (ref 39.0–52.0)
Hemoglobin: 12.8 g/dL — ABNORMAL LOW (ref 13.0–17.0)
MCH: 29.6 pg (ref 26.0–34.0)
MCHC: 32.2 g/dL (ref 30.0–36.0)
MCV: 92.1 fL (ref 80.0–100.0)
Platelets: 193 10*3/uL (ref 150–400)
RBC: 4.32 MIL/uL (ref 4.22–5.81)
RDW: 15.5 % (ref 11.5–15.5)
WBC: 9.2 10*3/uL (ref 4.0–10.5)
nRBC: 0 % (ref 0.0–0.2)

## 2019-04-18 LAB — FERRITIN: Ferritin: 610 ng/mL — ABNORMAL HIGH (ref 24–336)

## 2019-04-18 MED ORDER — METOPROLOL TARTRATE 5 MG/5ML IV SOLN
2.5000 mg | Freq: Four times a day (QID) | INTRAVENOUS | Status: DC
  Administered 2019-04-18 – 2019-04-21 (×11): 2.5 mg via INTRAVENOUS
  Filled 2019-04-18 (×10): qty 5

## 2019-04-18 NOTE — Progress Notes (Signed)
ANTICOAGULATION CONSULT NOTE - Follow Up Consult  Pharmacy Consult for heparin Indication: atrial fibrillation   Weight 68kg,  Height 68 inches Ideal body weight: 68.4 kg (150 lb 12.7 oz)  Heparin Dosing Weight: 68 kg  Vital Signs: Temp: 97.6 F (36.4 C) (12/23 2000) Temp Source: Axillary (12/23 2000) BP: 123/74 (12/23 2000) Pulse Rate: 60 (12/23 2000)  Labs: Recent Labs    03/28/2019 1039 04/20/2019 2225 04/17/19 0440 04/17/19 1105 04/18/19 0030  HGB 17.2*  --  15.6  --  12.8*  HCT 55.9*  --  50.8  --  39.8  PLT 270  --  230  --  193  HEPARINUNFRC  --  1.46*  --  0.81* 0.46  CREATININE 1.56*  --  1.60*  --  1.47*    Estimated Creatinine Clearance: 34.1 mL/min (A) (by C-G formula based on SCr of 1.47 mg/dL (H)).   Assessment: Patient's an 85 y.o M treated at [facility] from 04/05/19 to 04/15/19 for [place].  He was discharged to [facility] place on 12/21 and came back to the ED later that day d/t low sats.  Pharmacy was consulted to start heparin drip on 12/22 for afib.  Heparin level now therapeutic (0.46) on gtt at 600 units/hr. No bleeding noted.  Goal of Therapy:  Heparin level 0.3-0.7 units/ml Monitor platelets by anticoagulation protocol: Yes   Plan:  - Continue Heparin at 600 units/hr (6 ml/hr) - Will continue to monitor for any signs/symptoms of bleeding and will follow up with confirmation heparin level in 8 hours   Thank you for allowing pharmacy to be a part of this patient's care.  Sherlon Handing, PharmD, BCPS Please see amion for complete clinical pharmacist phone list 04/18/2019 3:09 AM

## 2019-04-18 NOTE — Progress Notes (Signed)
PROGRESS NOTE  Cody Bradshaw LKG:[id number] DOB: 1929-12-20 DOA: 04/17/2019  [facility]  Brief History/Interval Summary: 85 y.o. male with medical history significant for dementia and history of myocardial infarction who was recently admitted to [place] for treatment of COVID-19 pneumonia and was discharged on 12/21 but subsequently presented with worsening hypoxia and shortness of breath as well as new onset atrial fibrillation RVR.    Patient was noted to be delirious at presentation.  He was hospitalized for further management.    Reason for Visit: Acute respiratory failure with hypoxia.  Pneumonia due to COVID-19.  Atrial fibrillation with RVR.  Consultants: Palliative medicine  Procedures: PICC line placement 12/23  Antibiotics: Anti-infectives (From admission, onward)   None      Subjective/Interval History: Patient remains delirious this morning.  Unable to answer any questions.     Assessment/Plan:  Acute Hypoxic Resp. Failure/Pneumonia due to COVID-19  COVID-19 Labs  Recent Labs  Lab 04/12/19 0056 04/21/2019 1039 04/17/19 0440 04/18/19 0030  DDIMER 6.39* 2.24* 1.73* 1.15*  FERRITIN  --  636* 777* 610*  CRP  --  <0.5 0.5  --   ALT  --  52* 46* 40  PROCALCITON  --  <0.10  --   --     Objective findings: Fever: Mildly hypothermic. Oxygen requirements: HF [place] at 3 to 4 L/min saturating in the early to mid 90s.   COVID 19 Therapeutics: Antibacterials: None Remdesivir: Completed course of remdesivir recently Steroids: Steroids were discontinued. Diuretics: Not on diuretics on a scheduled basis Actemra: Given during previous hospitalization Convalescent Plasma: Given during previous hospitalization DVT Prophylaxis: Currently on heparin infusion due to A. fib  Patient was discharged on 4 L of oxygen.  Presented back to the hospital due to altered mental status.  Patient's oxygenation appears to have improved.  He was requiring  anywhere between 5 to 10 L initially.  Currently on 3 to 4 L.  From a respiratory standpoint he is stable.  He has completed treatment for his COVID-19.  He also received Actemra and convalescent plasma during his previous hospitalization.  The main issue now is his persistent encephalopathy.  Palliative medicine is following.  Atrial fibrillation with RVR This appears to be a new diagnosis.  Patient remains on diltiazem infusion.  Heart rate seems to be better controlled.  Due to encephalopathy patient is unable to take anything by mouth.  We will put him on scheduled metoprolol and start weaning down the diltiazem.  Patient is also on heparin infusion.   TSH 0.372.  Free T4 1.57.  Free T4 was 1.87 about 10 days ago.  Will not change treatments based on these levels.  Will recommend that these be checked in a few weeks time.  No echocardiogram noted in our system.  We may consider doing it in the next few days depending on his clinical status.  Acute metabolic encephalopathy in the setting of dementia/delirium Patient remains delirious.  This is most likely all secondary to Covid.  Electrolyte abnormalities as well as steroids could have contributed as well.  Steroids were discontinued.  Sodium level is slightly better today.  Continue to monitor.  Prognosis is remains guarded.    Acute kidney injury on chronic kidney disease stage III Dehydration due to poor oral intake is contributing.  Continue with IV fluids.  Monitor urine output.  Creatinine is slightly better today.    History of glaucoma Continue home medications.  Timolol eyedrops was held  during previous hospitalization due to significant bradycardia.  Holding again.  Hypernatremia Secondary to poor oral intake.  Continue D5 infusion.  Sodium level slightly better today.    Hyperbilirubinemia This is mild.  Most likely due to acute illness.  Continue to monitor periodically.  Lactic Acidosis Elevated lactic acid level noted at  admission at 3.5.  Most likely due to hypovolemia.  Improved with IV hydration.    DVT Prophylaxis: On IV heparin Code Status: DNR Family Communication: Wife was updated yesterday.  She is aware of guarded prognosis. Disposition Plan: Ordered prognosis.  Disposition is not clear yet.   Medications:  Scheduled: . brimonidine  1 drop Both Eyes TID  . brinzolamide  1 drop Both Eyes TID  . Chlorhexidine Gluconate Cloth  6 each Topical Daily  . Chlorhexidine Gluconate Cloth  6 each Topical Daily  . insulin aspart  0-5 Units Subcutaneous QHS  . insulin aspart  0-9 Units Subcutaneous TID WC  . Ipratropium-Albuterol  1 puff Inhalation Q6H  . latanoprost  1 drop Both Eyes QHS   Continuous: . dextrose 75 mL/hr at 04/18/19 0600  . diltiazem (CARDIZEM) infusion 15 mg/hr (04/18/19 1032)  . heparin Stopped (04/18/19 0559)   ZOX:[id number]:acetaminophen **OR** acetaminophen, bisacodyl, morphine injection **OR** morphine injection, OLANZapine, ondansetron **OR** ondansetron (ZOFRAN) IV, polyethylene glycol, polyvinyl alcohol, sodium chloride flush   Objective:  Vital Signs  Vitals:   04/18/19 0500 04/18/19 0823 04/18/19 0842 04/18/19 0843  BP:  138/78  138/78  Pulse:   79 79  Resp:   17 17  Temp:  (!) 96.8 F (36 C)  (!) 96.8 F (36 C)  TempSrc:  Oral  Oral  SpO2:   96%   Weight: 58.6 kg   58.6 kg  Height:    5' 7.99" (1.727 m)    Intake/Output Summary (Last 24 hours) at 04/18/2019 1102 Last data filed at 04/18/2019 0600 Gross per 24 hour  Intake 1879.27 ml  Output --  Net 1879.27 ml   Filed Weights   04/18/19 0500 04/18/19 0843  Weight: 58.6 kg 58.6 kg    General appearance: Remains delirious. Resp: Coarse breath sounds bilaterally.  Normal effort at rest.  Crackles at the bases.  No wheezing or rhonchi.   Cardio: S1-S2 is irregularly irregular.  Not tachycardic today. GI: Abdomen is soft.  Nontender nondistended.  Bowel sounds are present normal.  No masses  organomegaly Extremities: No edema.  Moving all his extremities Neurologic: Remains delirious.  No obvious focal deficits noted.    Lab Results:  Data Reviewed: I have personally reviewed following labs and imaging studies  CBC: Recent Labs  Lab 07-07-2018 1039 04/17/19 0440 04/18/19 0030  WBC 13.8* 16.3* 9.2  NEUTROABS 11.9* 13.7*  --   HGB 17.2* 15.6 12.8*  HCT 55.9* 50.8 39.8  MCV 93.8 93.9 92.1  PLT 270 230 193    Basic Metabolic Panel: Recent Labs  Lab 04/12/19 0056 04/13/19 0128 07-07-2018 1039 04/17/19 0440 04/18/19 0030  NA 144 144 153* 155* 153*  K 4.1 4.5 4.3 4.2 4.2  CL 109 110 117* 121* 124*  CO2 27 24 23  21* 22  GLUCOSE 190* 161* 132* 125* 211*  BUN 48* 54* 63* 66* 61*  CREATININE 1.11 1.21 1.56* 1.60* 1.47*  CALCIUM 9.7 9.4 10.2 10.1 9.5  MG  --   --  3.2* 2.8*  --   PHOS  --   --  3.7 3.8  --     GFR: Estimated Creatinine  Clearance: 29.3 mL/min (A) (by C-G formula based on SCr of 1.47 mg/dL (H)).  Liver Function Tests: Recent Labs  Lab 04/13/2019 1039 04/17/19 0440 04/18/19 0030  AST 34 37 29  ALT 52* 46* 40  ALKPHOS 80 77 59  BILITOT 1.4* 1.6* 1.7*  PROT 6.8 6.3* 5.2*  ALBUMIN 3.4* 3.3* 2.8*    CBG: Recent Labs  Lab 04/17/19 0744 04/17/19 1215 04/17/19 1609 04/17/19 2100 04/18/19 0733  GLUCAP 101* 103* 128* 171* 162*    Lipid Profile: Recent Labs    04/18/2019 1039  TRIG 258*    Thyroid Function Tests: Recent Labs    04/24/2019 1039  TSH 0.372  FREET4 1.57*    Anemia Panel: Recent Labs    04/17/19 0440 04/18/19 0030  FERRITIN 777* 610*    Recent Results (from the past 240 hour(s))  Blood Culture (routine x 2)     Status: None (Preliminary result)   Collection Time: 04/15/2019 10:44 AM   Specimen: BLOOD  Result Value Ref Range Status   Specimen Description   Final    BLOOD RIGHT ANTECUBITAL Performed at [facility], [address], [place], [place] [address]    Special Requests   Final    BOTTLES DRAWN  AEROBIC AND ANAEROBIC Blood Culture adequate volume Performed at [facility], [address] [place], [place] [address]    Culture   Final    NO GROWTH 2 DAYS Performed at [facility], [address], [place], [place] [address]    Report Status PENDING  Incomplete  Blood Culture (routine x 2)     Status: None (Preliminary result)   Collection Time: 04/18/2019 11:12 AM   Specimen: BLOOD  Result Value Ref Range Status   Specimen Description   Final    BLOOD LEFT ANTECUBITAL Performed at [facility], [address], [place], [place] [address]    Special Requests   Final    BOTTLES DRAWN AEROBIC AND ANAEROBIC Blood Culture adequate volume Performed at [facility], [address], [place], [place] [address]    Culture   Final    NO GROWTH 2 DAYS Performed at [facility], [address] [place], [place] [address]    Report Status PENDING  Incomplete      Radiology Studies: DG Chest Port 1 View  Result Date: 04/17/2019 CLINICAL DATA:  COVID, shortness of breath EXAM: PORTABLE CHEST 1 VIEW COMPARISON:  04/11/2019 FINDINGS: Cardiomegaly. Diffuse aortic atherosclerosis. Diffuse interstitial and airspace opacities, left slightly greater than right. No significant change since prior study. IMPRESSION: Diffuse bilateral interstitial and alveolar opacities, left slightly greater than right. No real change. Electronically Signed   By: Charlett Nose M.D.   On: 04/17/2019 08:44   [place] EKG SITE RITE  Result Date: 04/17/2019 If Site Rite image not attached, placement could not be confirmed due to current cardiac rhythm.      LOS: 2 days   Rheda Kassab Rito Ehrlich  [facility] Pager on [email or web address]  04/18/2019, 11:02 AM

## 2019-04-18 NOTE — Progress Notes (Signed)
  Palliative Medicine Inpatient Follow Up Note  This NP visited patient at the bedside as a follow up to yesterday's [place].  Reviewed patients chart. Sodium today appears worse. Patient has been NPO and somnolent per discussion with nursing staff. Continues to have failure to thrive. Reached out to patients daughter Mliss Sax to provide her with an update on the plan as outlined with her mother yesterday.   Called patients wife, Wilburn Cornelia to provide a daily update. Shared with her that there have been no meaningful changes as compared to the prior day. From the perspective of the patients mental. Provided emotional support to [place] as she is having a rough time being alone on the holiday. Patients children are not visiting out of concern for contracting COVID-19. Plan to continue to assess daily for improvements. If no meaningful improvements are being made will request for a bed at [facility].   Discussed with patient the importance of continued conversation with family and their  medical providers regarding overall plan of care and treatment options, ensuring decisions are within the context of the patients values and GOCs.  Questions and concerns addressed.   Discussed with Dr. Carilyn Goodpasture  Plan: DNAR/ DNI No Tube Feedings Chaplain consult Delirium Precautions  Time Spent: 35 Greater than 50% of the time was spent in counseling and coordination of care  Tacey Ruiz, NP Alda Lea, NP [place] Team Team Cell Phone: [phone]  Please utilize secure chat with additional questions, if there is no response within 30 minutes please call the above phone number  Palliative Medicine Team providers are available by phone from 7am to 7pm daily and can be reached through the team cell phone.  Should this patient require assistance outside of these hours, please call the patient's attending physician.

## 2019-04-18 NOTE — Progress Notes (Signed)
ANTICOAGULATION CONSULT NOTE - Follow Up Consult  Pharmacy Consult for heparin Indication: atrial fibrillation   Weight 68kg,  Height 68 inches Ideal body weight: 68.4 kg (150 lb 12.1 oz)  Heparin Dosing Weight: 68 kg  Vital Signs: Temp: 96.8 F (36 C) (12/24 0843) Temp Source: Oral (12/24 0843) BP: 128/79 (12/24 1130) Pulse Rate: 61 (12/24 1130)  Labs: Recent Labs    04/11/2019 1039 04/17/19 0440 04/17/19 1105 04/18/19 0030 04/18/19 1000  HGB 17.2* 15.6  --  12.8*  --   HCT 55.9* 50.8  --  39.8  --   PLT 270 230  --  193  --   HEPARINUNFRC  --   --  0.81* 0.46 0.45  CREATININE 1.56* 1.60*  --  1.47*  --     Estimated Creatinine Clearance: 29.3 mL/min (A) (by C-G formula based on SCr of 1.47 mg/dL (H)).   Assessment: Patient's an 85 y.o M treated at [facility] from 04/05/19 to 04/15/19 for [place].  He was discharged to [facility] place on 12/21 and came back to the ED later that day d/t low sats.  Pharmacy was consulted to start heparin drip on 12/22 for afib.  Heparin level now therapeutic (0.45) on gtt at 600 units/hr. Hgb down significantly could be dilutional component No bleeding per RN  Goal of Therapy:  Heparin level 0.3-0.7 units/ml Monitor platelets by anticoagulation protocol: Yes   Plan:  - Continue Heparin at 600 units/hr (6 ml/hr) - Will continue to monitor for any signs/symptoms of bleeding - Daily HL/CBC  Thank you for allowing pharmacy to be a part of this patient's care.  Ulice Dash, PharmD, BCPS 04/18/2019 11:30 AM

## 2019-04-19 DIAGNOSIS — J9601 Acute respiratory failure with hypoxia: Secondary | ICD-10-CM

## 2019-04-19 DIAGNOSIS — R0602 Shortness of breath: Secondary | ICD-10-CM

## 2019-04-19 LAB — COMPREHENSIVE METABOLIC PANEL
ALT: 40 U/L (ref 0–44)
AST: 32 U/L (ref 15–41)
Albumin: 2.7 g/dL — ABNORMAL LOW (ref 3.5–5.0)
Alkaline Phosphatase: 68 U/L (ref 38–126)
Anion gap: 6 (ref 5–15)
BUN: 50 mg/dL — ABNORMAL HIGH (ref 8–23)
CO2: 24 mmol/L (ref 22–32)
Calcium: 9.3 mg/dL (ref 8.9–10.3)
Chloride: 115 mmol/L — ABNORMAL HIGH (ref 98–111)
Creatinine, Ser: 1.16 mg/dL (ref 0.61–1.24)
GFR calc Af Amer: >60 mL/min (ref >60)
GFR calc non Af Amer: 56 mL/min — ABNORMAL LOW (ref >60)
Glucose, Bld: 104 mg/dL — ABNORMAL HIGH (ref 70–99)
Potassium: 4.5 mmol/L (ref 3.5–5.1)
Sodium: 145 mmol/L (ref 135–145)
Total Bilirubin: 1.8 mg/dL — ABNORMAL HIGH (ref 0.3–1.2)
Total Protein: 5 g/dL — ABNORMAL LOW (ref 6.5–8.1)

## 2019-04-19 LAB — CBC
HCT: 41.3 % (ref 39.0–52.0)
Hemoglobin: 12.9 g/dL — ABNORMAL LOW (ref 13.0–17.0)
MCH: 29.3 pg (ref 26.0–34.0)
MCHC: 31.2 g/dL (ref 30.0–36.0)
MCV: 93.9 fL (ref 80.0–100.0)
Platelets: 160 10*3/uL (ref 150–400)
RBC: 4.4 MIL/uL (ref 4.22–5.81)
RDW: 15.6 % — ABNORMAL HIGH (ref 11.5–15.5)
WBC: 11.6 10*3/uL — ABNORMAL HIGH (ref 4.0–10.5)
nRBC: 0 % (ref 0.0–0.2)

## 2019-04-19 LAB — GLUCOSE, CAPILLARY
Glucose-Capillary: 84 mg/dL (ref 70–99)
Glucose-Capillary: 59 mg/dL — ABNORMAL LOW (ref 70–99)
Glucose-Capillary: 92 mg/dL (ref 70–99)
Glucose-Capillary: 87 mg/dL (ref 70–99)

## 2019-04-19 LAB — HEPARIN LEVEL (UNFRACTIONATED): Heparin Unfractionated: 0.6 IU/mL (ref 0.30–0.70)

## 2019-04-19 MED ORDER — DEXTROSE 50 % IV SOLN
25.0000 g | Freq: Once | INTRAVENOUS | Status: AC
  Administered 2019-04-19: 17:00:00 25 g via INTRAVENOUS
  Filled 2019-04-19: qty 50

## 2019-04-19 NOTE — Progress Notes (Addendum)
ANTICOAGULATION CONSULT NOTE - Follow Up Consult  Pharmacy Consult for heparin Indication: atrial fibrillation (CHADS2VASc = 4)   Weight 68kg,  Height 68 inches Ideal body weight: 68.4 kg (150 lb 12.1 oz)  Heparin Dosing Weight: 68 kg  Vital Signs: Temp: 96.3 F (35.7 C) (12/25 0442) Temp Source: Axillary (12/25 0442) BP: 118/76 (12/25 0442) Pulse Rate: 56 (12/25 0442)  Labs: Recent Labs    04/17/19 0440 04/18/19 0030 04/18/19 1000 04/19/19 0556 04/19/19 0557  HGB 15.6 12.8*  --  12.9*  --   HCT 50.8 39.8  --  41.3  --   PLT 230 193  --  160  --   HEPARINUNFRC  --  0.46 0.45  --  0.60  CREATININE 1.60* 1.47*  --  1.16  --     Estimated Creatinine Clearance: 39 mL/min (by C-G formula based on SCr of 1.16 mg/dL).   Assessment: Patient's an 85 y.o M treated at [facility] from 04/05/19 to 04/15/19 for [place].  He was discharged to [facility] place on 12/21 and came back to the ED later that day d/t low sats.  Pharmacy was consulted to start heparin drip on 12/22 for afib (CHADS2VASc = 4).  Heparin level therapeutic (0.6) on gtt at 600 units/hr. Will monitor for uptrending HL. H/H & plt stable (was hemoconcentrated on admit > got IV fluid)  Goal of Therapy:  Heparin level 0.3-0.7 units/ml Monitor platelets by anticoagulation protocol: Yes   Plan:  - Continue Heparin at 600 units/hr (6 ml/hr) - Will continue to monitor for any signs/symptoms of bleeding - Daily HL/CBC  Thank you for allowing pharmacy to be a part of this patient's care.  Benetta Spar, PharmD, BCPS, [facility] Clinical Pharmacist

## 2019-04-19 NOTE — Progress Notes (Signed)
PROGRESS NOTE  Cody Bradshaw JJH:[id number] DOB: May 10, 1929 DOA: 03/26/2019  [facility]: [facility]  Brief History/Interval Summary: 85 y.o. male with medical history significant for dementia and history of myocardial infarction who was recently admitted to [place] for treatment of COVID-19 pneumonia and was discharged on 12/21 but subsequently presented with worsening hypoxia and shortness of breath as well as new onset atrial fibrillation RVR.    Patient was noted to be delirious at presentation.  He was hospitalized for further management.    Reason for Visit: Acute respiratory failure with hypoxia.  Pneumonia due to COVID-19.  Atrial fibrillation with RVR.  Consultants: Palliative medicine  Procedures: PICC line placement 12/23  Antibiotics: Anti-infectives (From admission, onward)   None      Subjective/Interval History: Patient noted to be calmer this morning.  Was able to track me a little bit.  He is very hard of hearing.   Assessment/Plan:  Acute Hypoxic Resp. Failure/Pneumonia due to COVID-19  From a COVID-19 standpoint patient seems to be stable.  He is requiring oxygen ranging anywhere from 4 to 10 L.  He tends to desaturate when he is agitated.  From a therapeutic standpoint he has completed course of remdesivir.  He has also completed course of steroids.  He also received Actemra and convalescent plasma during his previous hospitalization.  CRP was 0.5 on 12/23.  D-dimer was only minimally elevated.  Atrial fibrillation with RVR This is a new diagnosis for him.  Patient was initially placed on diltiazem infusion.  He was started on low-dose of metoprolol.  He is now in sinus bradycardic rhythm.  Continue IV metoprolol with holding parameters.  He appears to be very sensitive to beta-blockers.  He is currently on IV heparin which will be continued.  Due to encephalopathy patient is unable to take anything by mouth.  No echocardiogram noted in our  system.  We may consider doing it in the next few days depending on how he progresses over the next 2 to 3 days.  TSH 0.372.  Free T4 1.57.  Free T4 was 1.87 about 10 days ago.  Will not change treatments based on these levels.  Will recommend that these be checked in a few weeks time.    Acute metabolic encephalopathy in the setting of dementia/delirium Patient has been encephalopathic the last several days.  Seems to be slightly calmer today.  We will see how he does over the next 24 and 48 hours.  If he continues to remain encephalopathic with poor oral intake then that would portend a poor prognosis.  Prognosis at this time remains guarded.  Acute kidney injury on chronic kidney disease stage III Acute renal injury was most likely due to dehydration.  He is being gently hydrated.  Monitor urine output.  Creatinine has improved.    Hypernatremia Secondary to poor oral intake.  Continue D5 infusion.  Sodium level has improved.    History of glaucoma Continue home medications.  Timolol eyedrops was held during previous hospitalization due to significant bradycardia.  Holding again.  Hyperbilirubinemia This is mild.  Most likely due to acute illness.  Continue to monitor periodically.  Lactic Acidosis Elevated lactic acid level noted at admission at 3.5.  Most likely due to hypovolemia.  Improved with IV hydration.  Goals of care Ongoing discussions with wife.  Palliative medicine is also following.  If patient does not improve in the next 24 to 48 hours then we may have to consider  hospice.  DVT Prophylaxis: On IV heparin Code Status: DNR Family Communication: Wife being updated on a daily basis. Disposition Plan: Guarded prognosis.  See above.   Medications:  Scheduled: . brimonidine  1 drop Both Eyes TID  . brinzolamide  1 drop Both Eyes TID  . Chlorhexidine Gluconate Cloth  6 each Topical Daily  . Chlorhexidine Gluconate Cloth  6 each Topical Daily  . insulin aspart  0-5  Units Subcutaneous QHS  . insulin aspart  0-9 Units Subcutaneous TID WC  . Ipratropium-Albuterol  1 puff Inhalation Q6H  . latanoprost  1 drop Both Eyes QHS  . metoprolol tartrate  2.5 mg Intravenous Q6H   Continuous: . dextrose 75 mL/hr at 04/19/19 0604  . diltiazem (CARDIZEM) infusion Stopped (04/18/19 1348)  . heparin 600 Units/hr (04/19/19 0400)   EPP:IRJJOACZYSAYT **OR** acetaminophen, bisacodyl, morphine injection **OR** morphine injection, OLANZapine, ondansetron **OR** ondansetron (ZOFRAN) IV, polyethylene glycol, polyvinyl alcohol, sodium chloride flush   Objective:  Vital Signs  Vitals:   04/19/19 0400 04/19/19 0442 04/19/19 0500 04/19/19 0903  BP:  118/76    Pulse: (!) 58 (!) 56    Resp: (!) 9 10  12   Temp:  (!) 96.3 F (35.7 C)    TempSrc:  Axillary    SpO2: 100% 100%    Weight:   61.4 kg   Height:        Intake/Output Summary (Last 24 hours) at 04/19/2019 1037 Last data filed at 04/19/2019 0400 Gross per 24 hour  Intake 1474.55 ml  Output --  Net 1474.55 ml   Filed Weights   04/18/19 0500 04/18/19 0843 04/19/19 0500  Weight: 58.6 kg 58.6 kg 61.4 kg    General appearance: Slightly calmer this morning. Resp: Coarse breath sound bilaterally.  Crackles at the bases.  No wheezing or rhonchi. Cardio: S1-S2 is normal.  Bradycardic. GI: Abdomen is soft.  Nontender nondistended.  Bowel sounds are present normal.  No masses organomegaly Extremities: No edema.  Noted to be moving all his extremities Neurologic: Hard of hearing.  Opens his eyes when I called his name.  Otherwise does not respond.  No obvious focal deficits.   Lab Results:  Data Reviewed: I have personally reviewed following labs and imaging studies  CBC: Recent Labs  Lab 04/12/2019 1039 04/17/19 0440 04/18/19 0030 04/19/19 0556  WBC 13.8* 16.3* 9.2 11.6*  NEUTROABS 11.9* 13.7*  --   --   HGB 17.2* 15.6 12.8* 12.9*  HCT 55.9* 50.8 39.8 41.3  MCV 93.8 93.9 92.1 93.9  PLT 270 230 193 160     Basic Metabolic Panel: Recent Labs  Lab 04/13/19 0128 03/27/2019 1039 04/17/19 0440 04/18/19 0030 04/19/19 0556  NA 144 153* 155* 153* 145  K 4.5 4.3 4.2 4.2 4.5  CL 110 117* 121* 124* 115*  CO2 24 23 21* 22 24  GLUCOSE 161* 132* 125* 211* 104*  BUN 54* 63* 66* 61* 50*  CREATININE 1.21 1.56* 1.60* 1.47* 1.16  CALCIUM 9.4 10.2 10.1 9.5 9.3  MG  --  3.2* 2.8*  --   --   PHOS  --  3.7 3.8  --   --     GFR: Estimated Creatinine Clearance: 39 mL/min (by C-G formula based on SCr of 1.16 mg/dL).  Liver Function Tests: Recent Labs  Lab 04/18/2019 1039 04/17/19 0440 04/18/19 0030 04/19/19 0556  AST 34 37 29 32  ALT 52* 46* 40 40  ALKPHOS 80 77 59 68  BILITOT 1.4* 1.6* 1.7*  1.8*  PROT 6.8 6.3* 5.2* 5.0*  ALBUMIN 3.4* 3.3* 2.8* 2.7*    CBG: Recent Labs  Lab 04/18/19 0733 04/18/19 1125 04/18/19 1643 04/18/19 2027 04/19/19 0826  GLUCAP 162* 131* 105* 103* 84    Lipid Profile: Recent Labs    20-Nov-2018 1039  TRIG 258*    Thyroid Function Tests: Recent Labs    20-Nov-2018 1039  TSH 0.372  FREET4 1.57*    Anemia Panel: Recent Labs    04/17/19 0440 04/18/19 0030  FERRITIN 777* 610*    Recent Results (from the past 240 hour(s))  Blood Culture (routine x 2)     Status: None (Preliminary result)   Collection Time: 20-Nov-2018 10:44 AM   Specimen: BLOOD  Result Value Ref Range Status   Specimen Description   Final    BLOOD RIGHT ANTECUBITAL Performed at [facility], [address] [place], [place] [address]    Special Requests   Final    BOTTLES DRAWN AEROBIC AND ANAEROBIC Blood Culture adequate volume Performed at [facility], [address] [place], [place] [address]    Culture   Final    NO GROWTH 3 DAYS Performed at [facility], [address] [place], [place] [address]    Report Status PENDING  Incomplete  Blood Culture (routine x 2)     Status: None (Preliminary result)   Collection Time: 20-Nov-2018 11:12 AM    Specimen: BLOOD  Result Value Ref Range Status   Specimen Description   Final    BLOOD LEFT ANTECUBITAL Performed at [facility], [address] [place], [place] [address]    Special Requests   Final    BOTTLES DRAWN AEROBIC AND ANAEROBIC Blood Culture adequate volume Performed at [facility], [address] [place], [place] [address]    Culture   Final    NO GROWTH 3 DAYS Performed at [facility], [address] [place], [place] [address]    Report Status PENDING  Incomplete      Radiology Studies: No results found.     LOS: 3 days   Sylvan Lahm [employer] LockerKrishnan  [facility] Pager on [email or web address]  04/19/2019, 10:37 AM

## 2019-04-19 NOTE — Progress Notes (Signed)
  Palliative Medicine Inpatient Follow Up Note  This NP reviewed patients chart. Touched base with patients bedside RN, Cody Bradshaw who said the report she received was that he continued to pull at lines requiring restraints overnight. He is awake this morning, O2 needs are decreasing.   Called patients wife, Cody Bradshaw who shared that this is the second Christmas she has not been with her husband in 55 years. The first time was during his time in [place] during the [place] conflict. She is feeling lonely, having a very hard time as her family has not visited much due to their concern for getting/giving her COVID-19.    Therapeutic listening and emotional support was offered.  Discussed with patient the importance of continued conversation with family and their  medical providers regarding overall plan of care and treatment options, ensuring decisions are within the context of the patients values and GOCs.  Questions and concerns addressed.   Secure messaged  Cody Bradshaw  Plan: DNAR/ DNI No Tube Feedings Chaplain consult Metabolic Encephalopathy:  - Delirium precautions  -  Continue olanzapine IM while NPO  - If patients get to a point where he could tolerate POs would consider  Depakote sprinkles 125mg  BID   Time Spent: 35 Greater than 50% of the time was spent in counseling and coordination of care  Cody Ruiz, NP Cody Lea, NP [facility] Team Team Cell Phone: [phone]  Please utilize secure chat with additional questions, if there is no response within 30 minutes please call the above phone number  Palliative Medicine Team providers are available by phone from 7am to 7pm daily and can be reached through the team cell phone.  Should this patient require assistance outside of these hours, please call the patient's attending physician.

## 2019-04-20 LAB — COMPREHENSIVE METABOLIC PANEL
ALT: 50 U/L — ABNORMAL HIGH (ref 0–44)
AST: 38 U/L (ref 15–41)
Albumin: 2.7 g/dL — ABNORMAL LOW (ref 3.5–5.0)
Alkaline Phosphatase: 90 U/L (ref 38–126)
Anion gap: 8 (ref 5–15)
BUN: 38 mg/dL — ABNORMAL HIGH (ref 8–23)
CO2: 23 mmol/L (ref 22–32)
Calcium: 9.4 mg/dL (ref 8.9–10.3)
Chloride: 111 mmol/L (ref 98–111)
Creatinine, Ser: 1.11 mg/dL (ref 0.61–1.24)
GFR calc Af Amer: >60 mL/min (ref >60)
GFR calc non Af Amer: 59 mL/min — ABNORMAL LOW (ref >60)
Glucose, Bld: 97 mg/dL (ref 70–99)
Potassium: 4.2 mmol/L (ref 3.5–5.1)
Sodium: 142 mmol/L (ref 135–145)
Total Bilirubin: 2 mg/dL — ABNORMAL HIGH (ref 0.3–1.2)
Total Protein: 5.2 g/dL — ABNORMAL LOW (ref 6.5–8.1)

## 2019-04-20 LAB — GLUCOSE, CAPILLARY
Glucose-Capillary: 51 mg/dL — ABNORMAL LOW (ref 70–99)
Glucose-Capillary: 48 mg/dL — ABNORMAL LOW (ref 70–99)
Glucose-Capillary: 85 mg/dL (ref 70–99)
Glucose-Capillary: 90 mg/dL (ref 70–99)

## 2019-04-20 LAB — CBC
HCT: 43.5 % (ref 39.0–52.0)
Hemoglobin: 13.9 g/dL (ref 13.0–17.0)
MCH: 29 pg (ref 26.0–34.0)
MCHC: 32 g/dL (ref 30.0–36.0)
MCV: 90.8 fL (ref 80.0–100.0)
Platelets: 144 10*3/uL — ABNORMAL LOW (ref 150–400)
RBC: 4.79 MIL/uL (ref 4.22–5.81)
RDW: 15.3 % (ref 11.5–15.5)
WBC: 11.4 10*3/uL — ABNORMAL HIGH (ref 4.0–10.5)
nRBC: 0 % (ref 0.0–0.2)

## 2019-04-20 LAB — HEPARIN LEVEL (UNFRACTIONATED): Heparin Unfractionated: 0.35 IU/mL (ref 0.30–0.70)

## 2019-04-20 MED ORDER — DEXTROSE 10 % IV SOLN: INTRAVENOUS | Status: DC

## 2019-04-20 MED ORDER — DEXTROSE 50 % IV SOLN
25.0000 g | Freq: Once | INTRAVENOUS | Status: AC
  Administered 2019-04-20: 12:00:00 25 g via INTRAVENOUS
  Filled 2019-04-20: qty 50

## 2019-04-20 MED ORDER — RESOURCE THICKENUP CLEAR PO POWD
ORAL | Status: DC | PRN
  Filled 2019-04-20: qty 125

## 2019-04-20 MED ORDER — DEXTROSE 50 % IV SOLN
25.0000 g | Freq: Once | INTRAVENOUS | Status: AC
  Administered 2019-04-20: 09:00:00 25 g via INTRAVENOUS
  Filled 2019-04-20: qty 50

## 2019-04-20 NOTE — Progress Notes (Signed)
PROGRESS NOTE  Cody Bradshaw IRW:[id number] DOB: 05-08-1929 DOA: 04/15/2019  PCP: [facility]  Brief History/Interval Summary: 85 y.o. male with medical history significant for dementia and history of myocardial infarction who was recently admitted to [facility] medical campus for treatment of COVID-19 pneumonia and was discharged on 12/21 but subsequently presented with worsening hypoxia and shortness of breath as well as new onset atrial fibrillation RVR.    Patient was noted to be delirious at presentation.  He was hospitalized for further management.    Reason for Visit: Acute respiratory failure with hypoxia.  Pneumonia due to COVID-19.  Atrial fibrillation with RVR.  Consultants: Palliative medicine  Procedures: PICC line placement 12/23  Antibiotics: Anti-infectives (From admission, onward)   None      Subjective/Interval History: Patient remains confused.  Apparently had to be restrained again overnight.  Opens his eyes to verbal command.  But does not really communicate otherwise.   Assessment/Plan:  Acute Hypoxic Resp. Failure/Pneumonia due to COVID-19 From a COVID-19 standpoint patient seems to be stable.  He is still requiring a lot of oxygen, anywhere from 4 to 10 L.  Tends to desaturate especially with agitation.   From a therapeutic standpoint he has completed course of remdesivir and steroids.  He also received Actemra and convalescent plasma during his previous hospitalization.  Inflammatory markers have improved.  D-dimer was only minimally elevated.  Acute metabolic encephalopathy in the setting of dementia/delirium Patient remains encephalopathic with.'s of agitation.  Has been calmer the last 24 hours.  Patient may wax and wane a bit.  Unclear if he is going to be better enough for him to start eating and drinking.   Atrial fibrillation with RVR This is a new diagnosis for him.  Patient was initially placed on diltiazem infusion.  He was started on  low-dose of metoprolol.  Heart rate is well controlled.  Going in and out of atrial fibrillation.  He is on IV heparin.   Due to encephalopathy patient is unable to take anything by mouth.  No echocardiogram noted in our system.  Considering his guarded to poor prognosis will not pursue echocardiogram at this time.    TSH 0.372.  Free T4 1.57.  Free T4 was 1.87 about 10 days ago.  Will not change treatments based on these levels.  Will recommend that these be checked in a few weeks time depending on his clinical status at that time.    Acute kidney injury on chronic kidney disease stage III Acute renal injury was most likely due to dehydration.  He is being gently hydrated.  Monitor urine output.  Creatinine has improved.    Hypoglycemia Patient noted to have low glucose levels.  This is despite being on a D5 infusion.  This is most likely due to poor oral intake but also portends a poor prognosis.  Will transition to D10 infusion for now.  Hypernatremia Improved with D5 infusion.  History of glaucoma Continue home medications.  Timolol eyedrops was held during previous hospitalization due to significant bradycardia.  Holding again.  Hyperbilirubinemia This is mild.  Most likely due to acute illness.  Continue to monitor periodically.  Lactic Acidosis Elevated lactic acid level noted at admission at 3.5.  Most likely due to hypovolemia.  Improved with IV hydration.  Goals of care Palliative medicine is following.  Ongoing discussions with patient's wife.  Patient's prognosis seems to be getting poor.  He is now hypoglycemic.  He is calmer but continues to  have episodes of agitation.  Will discuss with wife today.  Will recommend transition to hospice and comfort care.    DVT Prophylaxis: On IV heparin Code Status: DNR Family Communication: Wife being updated on a daily basis. Disposition Plan: Poor prognosis.   Medications:  Scheduled: . brimonidine  1 drop Both Eyes TID  .  brinzolamide  1 drop Both Eyes TID  . Chlorhexidine Gluconate Cloth  6 each Topical Daily  . Chlorhexidine Gluconate Cloth  6 each Topical Daily  . Ipratropium-Albuterol  1 puff Inhalation Q6H  . latanoprost  1 drop Both Eyes QHS  . metoprolol tartrate  2.5 mg Intravenous Q6H   Continuous: . dextrose 75 mL/hr at 04/20/19 0300  . heparin 600 Units/hr (04/20/19 0300)   UXN:ATFTDDUKGURKY **OR** acetaminophen, bisacodyl, morphine injection **OR** morphine injection, OLANZapine, ondansetron **OR** ondansetron (ZOFRAN) IV, polyethylene glycol, polyvinyl alcohol, sodium chloride flush   Objective:  Vital Signs  Vitals:   04/19/19 2100 04/19/19 2200 04/20/19 0408 04/20/19 0738  BP:   (!) 122/94 138/66  Pulse: 82 (!) 37 60 88  Resp: 15 16 19 16   Temp:   97.6 F (36.4 C)   TempSrc:   Axillary   SpO2: 96% 98% 99% 92%  Weight:      Height:        Intake/Output Summary (Last 24 hours) at 04/20/2019 1140 Last data filed at 04/20/2019 0300 Gross per 24 hour  Intake 1457.95 ml  Output 600 ml  Net 857.95 ml   Filed Weights   04/18/19 0500 04/18/19 0843 04/19/19 0500  Weight: 58.6 kg 58.6 kg 61.4 kg    General appearance: Remains confused.  Does open his eyes. Resp: Coarse breath sound bilaterally.  Crackles at the bases.  No wheezing or rhonchi. Cardio: S1-S2 is irregularly irregular.  No S3-S4. GI: Abdomen is soft.  Nontender nondistended.  Bowel sounds are present normal.  No masses organomegaly Extremities: No edema.   Neurologic: Remains confused.  Does not answer questions.     Lab Results:  Data Reviewed: I have personally reviewed following labs and imaging studies  CBC: Recent Labs  Lab 04/30/2019 1039 04/17/19 0440 04/18/19 0030 04/19/19 0556 04/20/19 0500  WBC 13.8* 16.3* 9.2 11.6* 11.4*  NEUTROABS 11.9* 13.7*  --   --   --   HGB 17.2* 15.6 12.8* 12.9* 13.9  HCT 55.9* 50.8 39.8 41.3 43.5  MCV 93.8 93.9 92.1 93.9 90.8  PLT 270 230 193 160 144*    Basic  Metabolic Panel: Recent Labs  Lab 04/30/2019 1039 04/17/19 0440 04/18/19 0030 04/19/19 0556 04/20/19 0500  NA 153* 155* 153* 145 142  K 4.3 4.2 4.2 4.5 4.2  CL 117* 121* 124* 115* 111  CO2 23 21* 22 24 23   GLUCOSE 132* 125* 211* 104* 97  BUN 63* 66* 61* 50* 38*  CREATININE 1.56* 1.60* 1.47* 1.16 1.11  CALCIUM 10.2 10.1 9.5 9.3 9.4  MG 3.2* 2.8*  --   --   --   PHOS 3.7 3.8  --   --   --     GFR: Estimated Creatinine Clearance: 40.7 mL/min (by C-G formula based on SCr of 1.11 mg/dL).  Liver Function Tests: Recent Labs  Lab 04-30-19 1039 04/17/19 0440 04/18/19 0030 04/19/19 0556 04/20/19 0500  AST 34 37 29 32 38  ALT 52* 46* 40 40 50*  ALKPHOS 80 77 59 68 90  BILITOT 1.4* 1.6* 1.7* 1.8* 2.0*  PROT 6.8 6.3* 5.2* 5.0* 5.2*  ALBUMIN  3.4* 3.3* 2.8* 2.7* 2.7*    CBG: Recent Labs  Lab 04/19/19 0826 04/19/19 1601 04/19/19 1852 04/19/19 2007 04/20/19 0801  GLUCAP 84 59* 92 87 51*    Anemia Panel: Recent Labs    04/18/19 0030  FERRITIN 610*    Recent Results (from the past 240 hour(s))  Blood Culture (routine x 2)     Status: None (Preliminary result)   Collection Time: 04/18/2019 10:44 AM   Specimen: BLOOD  Result Value Ref Range Status   Specimen Description   Final    BLOOD RIGHT ANTECUBITAL Performed at [facility], [address] [place], [place] [address]    Special Requests   Final    BOTTLES DRAWN AEROBIC AND ANAEROBIC Blood Culture adequate volume Performed at [facility], [address] [place], [place] [address]    Culture   Final    NO GROWTH 4 DAYS Performed at [facility], [address] [place], [place] [address]    Report Status PENDING  Incomplete  Blood Culture (routine x 2)     Status: None (Preliminary result)   Collection Time: 03/27/2019 11:12 AM   Specimen: BLOOD  Result Value Ref Range Status   Specimen Description   Final    BLOOD LEFT ANTECUBITAL Performed at [facility], [address] [place], [place] [address]    Special Requests   Final    BOTTLES DRAWN AEROBIC AND ANAEROBIC Blood Culture adequate volume Performed at [facility], [address] [place], [place] [address]    Culture   Final    NO GROWTH 4 DAYS Performed at [facility], [address] [place], [place] [address]    Report Status PENDING  Incomplete      Radiology Studies: No results found.     LOS: 4 days   Payne Garske [employer]rishnan  [facility] Pager on [email or web address]  04/20/2019, 11:40 AM

## 2019-04-20 NOTE — Evaluation (Signed)
Clinical/Bedside Swallow Evaluation Patient Details  Name: Cody Bradshaw MRN: [id number] Date of Birth: 1929/11/01  Today's Date: 04/20/2019 Time: SLP Start Time (ACUTE ONLY): 1425 SLP Stop Time (ACUTE ONLY): 1440 SLP Time Calculation (min) (ACUTE ONLY): 15 min  Past Medical History:  Past Medical History:  Diagnosis Date  . Dementia ([place])   . GERD (gastroesophageal reflux disease)   . Hypertension   . Lab test positive for detection of COVID-19 virus   . MI (myocardial infarction) [facility])    Past Surgical History: History reviewed. No pertinent surgical history. HPI:  85 y.o. male with medical history significant for dementia and history of myocardial infarction who was recently admitted to [facility] for treatment of COVID-19 pneumonia and was discharged on 12/21 but subsequently presented with worsening hypoxia and shortness of breath as well as new onset atrial fibrillation RVR.      Assessment / Plan / Recommendation Clinical Impression  Pt alert, tremulous, HOH but occasionally responsive. Needs multimodal cueing to increase awareness of PO; eventiually participated in hand over hand assist and accepted straw sips. Very minimal oral opening for spoon with puree. There was delayed coughign with thin liquids x2, but honey was consumed without incident. Difficult to make subjective assessment in this case due to minimal intake, but generally thickend liquids appeared easier and well tolerated. Will initiate a puree/honey thick diet, but RN may attempt sips of water or Ensure if needed to increase oral intake. Will follow for tolerance.  SLP Visit Diagnosis: Dysphagia, unspecified (R13.10)    Aspiration Risk  Moderate aspiration risk;Risk for inadequate nutrition/hydration    Diet Recommendation Dysphagia 1 (Puree);Honey-thick liquid   Liquid Administration via: Cup;Spoon;Straw Medication Administration: Crushed with puree Supervision: Staff to assist with self  feeding;Full supervision/cueing for compensatory strategies Compensations: Slow rate;Small sips/bites Postural Changes: Seated upright at 90 degrees    Other  Recommendations Oral Care Recommendations: Oral care BID Other Recommendations: Order thickener from pharmacy   Follow up Recommendations 24 hour supervision/assistance      Frequency and Duration min 2x/week  2 weeks       Prognosis Prognosis for Safe Diet Advancement: Fair      Swallow Study   General HPI: 85 y.o. male with medical history significant for dementia and history of myocardial infarction who was recently admitted to [facility] for treatment of COVID-19 pneumonia and was discharged on 12/21 but subsequently presented with worsening hypoxia and shortness of breath as well as new onset atrial fibrillation RVR.    Type of Study: Bedside Swallow Evaluation Diet Prior to this Study: NPO Temperature Spikes Noted: No Respiratory Status: Nasal cannula History of Recent Intubation: No Behavior/Cognition: Alert;Distractible;Requires cueing Oral Cavity Assessment: Dry Oral Care Completed by SLP: No Oral Cavity - Dentition: Adequate natural dentition Self-Feeding Abilities: Total assist Patient Positioning: Upright in bed Baseline Vocal Quality: Low vocal intensity Volitional Cough: Cognitively unable to elicit Volitional Swallow: Unable to elicit    Oral/Motor/Sensory Function Overall Oral Motor/Sensory Function: Other (comment)(cannot follow commands for oral motor)   Ice Chips Ice chips: Impaired Presentation: Spoon Oral Phase Impairments: Poor awareness of bolus;Other (comment)(refused ice)   Thin Liquid Thin Liquid: Impaired Pharyngeal  Phase Impairments: Cough - Immediate    Nectar Thick Nectar Thick Liquid: Not tested   Honey Thick Honey Thick Liquid: Within functional limits Presentation: Spoon;Straw   Puree Puree: Impaired Presentation: Spoon Oral Phase Impairments: Other  (comment)(minimal acceptance)   Solid     Solid: Not tested  Harlon Ditty, MA CCC-SLP  Acute Rehabilitation Services Pager [phone] Office [phone]  Cody Bradshaw, Riley Nearing 04/20/2019,2:47 PM

## 2019-04-20 NOTE — Progress Notes (Addendum)
Dr. Vanita Ingles notified of frequent PVC's  Patients vitals are stable, placed on a NRB at shift change saturations were 75-90, O2 prob changed on ear, Bed changed due to patient pulling off condom cath and incont. Of urine. Patient appears restless pulling at oxygen and telemetry. Will continue to monitor

## 2019-04-21 LAB — CULTURE, BLOOD (ROUTINE X 2)
Culture: NO GROWTH
Culture: NO GROWTH
Special Requests: ADEQUATE
Special Requests: ADEQUATE

## 2019-04-21 LAB — CBC
HCT: 43.3 % (ref 39.0–52.0)
Hemoglobin: 14.3 g/dL (ref 13.0–17.0)
MCH: 29.4 pg (ref 26.0–34.0)
MCHC: 33 g/dL (ref 30.0–36.0)
MCV: 89.1 fL (ref 80.0–100.0)
Platelets: 82 10*3/uL — ABNORMAL LOW (ref 150–400)
RBC: 4.86 MIL/uL (ref 4.22–5.81)
RDW: 15.1 % (ref 11.5–15.5)
WBC: 12.1 10*3/uL — ABNORMAL HIGH (ref 4.0–10.5)
nRBC: 0 % (ref 0.0–0.2)

## 2019-04-21 LAB — BASIC METABOLIC PANEL
Anion gap: 8 (ref 5–15)
BUN: 33 mg/dL — ABNORMAL HIGH (ref 8–23)
CO2: 21 mmol/L — ABNORMAL LOW (ref 22–32)
Calcium: 9 mg/dL (ref 8.9–10.3)
Chloride: 104 mmol/L (ref 98–111)
Creatinine, Ser: 1.22 mg/dL (ref 0.61–1.24)
GFR calc Af Amer: >60 mL/min (ref >60)
GFR calc non Af Amer: 53 mL/min — ABNORMAL LOW (ref >60)
Glucose, Bld: 427 mg/dL — ABNORMAL HIGH (ref 70–99)
Potassium: 3.9 mmol/L (ref 3.5–5.1)
Sodium: 133 mmol/L — ABNORMAL LOW (ref 135–145)

## 2019-04-21 LAB — GLUCOSE, CAPILLARY
Glucose-Capillary: 103 mg/dL — ABNORMAL HIGH (ref 70–99)
Glucose-Capillary: 124 mg/dL — ABNORMAL HIGH (ref 70–99)
Glucose-Capillary: 105 mg/dL — ABNORMAL HIGH (ref 70–99)
Glucose-Capillary: 92 mg/dL (ref 70–99)

## 2019-04-21 LAB — MAGNESIUM: Magnesium: 2.1 mg/dL (ref 1.7–2.4)

## 2019-04-21 MED ORDER — BIOTENE DRY MOUTH MT LIQD: 15.0000 mL | OROMUCOSAL | Status: DC | PRN

## 2019-04-21 MED ORDER — LORAZEPAM 2 MG/ML PO CONC: 1.0000 mg | ORAL | Status: DC | PRN

## 2019-04-21 MED ORDER — GLYCOPYRROLATE 0.2 MG/ML IJ SOLN: 0.2000 mg | INTRAMUSCULAR | Status: DC | PRN

## 2019-04-21 MED ORDER — LORAZEPAM 2 MG/ML IJ SOLN
1.0000 mg | INTRAMUSCULAR | Status: DC | PRN
  Administered 2019-04-22 (×3): 1 mg via INTRAVENOUS
  Filled 2019-04-21 (×3): qty 1

## 2019-04-21 MED ORDER — MORPHINE SULFATE (PF) 2 MG/ML IV SOLN
2.0000 mg | INTRAVENOUS | Status: DC | PRN
  Administered 2019-04-22: 06:00:00 2 mg via INTRAVENOUS
  Filled 2019-04-21: qty 1

## 2019-04-21 MED ORDER — HALOPERIDOL LACTATE 5 MG/ML IJ SOLN: 0.5000 mg | INTRAMUSCULAR | Status: DC | PRN

## 2019-04-21 MED ORDER — LORAZEPAM 0.5 MG PO TABS: 1.0000 mg | ORAL_TABLET | ORAL | Status: DC | PRN

## 2019-04-21 MED ORDER — HALOPERIDOL 0.5 MG PO TABS
0.5000 mg | ORAL_TABLET | ORAL | Status: DC | PRN
  Filled 2019-04-21: qty 1

## 2019-04-21 MED ORDER — GLYCOPYRROLATE 1 MG PO TABS
1.0000 mg | ORAL_TABLET | ORAL | Status: DC | PRN
  Filled 2019-04-21: qty 1

## 2019-04-21 MED ORDER — HALOPERIDOL LACTATE 2 MG/ML PO CONC
0.5000 mg | ORAL | Status: DC | PRN
  Filled 2019-04-21: qty 0.3

## 2019-04-21 NOTE — Progress Notes (Addendum)
Patient vomited (coffee ground emesis) when turned for linen change. Suction at bedside, oxygen/ vitals stable. Does not appear to have aspirated, MD notified  Central tele called and confirmed QT interval 0.38

## 2019-04-21 NOTE — Progress Notes (Addendum)
Patient resting comfortably in bed. No concerns voiced. Will continue to monitor    Patient continues to rest quietly in bed. 20:41  Patient appears comfortable in bed with NRB on. Voiced no concerns 22:24  RN checked on patients needs @ 23:03 no needs voiced, patient appears comfortable  Patient still resting quietly in bed 0040  Patient pulled off NRB RN placed it back on patient, now resting comfortably. [phone]

## 2019-04-21 NOTE — Progress Notes (Signed)
PROGRESS NOTE  Cody Bradshaw ZOX:[id number] DOB: [birth year] DOA: 03/29/2019  [facility]: [facility]  Brief History/Interval Summary: 85 y.o. male with medical history significant for dementia and history of myocardial infarction who was [facility] for treatment of COVID-19 pneumonia and was discharged on 12/21 but subsequently presented with worsening hypoxia and shortness of breath as well as new onset atrial fibrillation RVR.    Patient was noted to be delirious at presentation.  He was hospitalized for further management.    Reason for Visit: Acute respiratory failure with hypoxia.  Pneumonia due to COVID-19.  Atrial fibrillation with RVR.  Consultants: Palliative medicine  Procedures: PICC line placement 12/23  Antibiotics: Anti-infectives (From admission, onward)   None      Subjective/Interval History: Overnight events noted.  Patient became hypoxic.  Might have aspirated.  Noted to be on a nonrebreather this morning.  He is not responding at all.  .   Assessment/Plan:  Acute Hypoxic Resp. Failure/Pneumonia due to COVID-19 From a COVID-19 standpoint patient had been stable.  He was still requiring a lot of oxygen.  Yesterday he was down to 4 L.  However he appears to have aspirated overnight.  Noted to be on a nonrebreather this morning.  He is not responding.  He has worsened significantly.  Prognosis is very poor at this time.  Family visit to be facilitated.    From a COVID-19 therapeutic standpoint he has completed course of remdesivir and steroids.  He also received Actemra and convalescent plasma during previous hospitalization.    Acute metabolic encephalopathy in the setting of dementia/delirium Patient remains encephalopathic.  He may not be as agitated as before but he definitely remains confused.  Overnight he has worsened.  He is not responding at all today.  Prognosis is very poor at this time.     Atrial fibrillation with  RVR This is a new diagnosis for him.  Patient was initially placed on diltiazem infusion.  He was started on low-dose of metoprolol.  Heart rate has been controlled.  He has been going in and out of atrial fibrillation.  Overnight he started having PVCs.  Since now his prognosis is very poor we will take him off of telemetry.    Abnormal thyroid function tests TSH 0.372.  Free T4 1.57.  Free T4 was 1.87 on 12/13.  Considering poor prognosis no further work-up at this time.    Acute kidney injury on chronic kidney disease stage III Acute renal injury was most likely due to dehydration.  Gently hydrated.  Creatinine has improved.  Hypoglycemia Patient with persistent hypoglycemic episodes yesterday.  This is due to poor oral intake and general decline in his condition.  He was started on a D10 infusion to decrease his family is able to come and visit him.  After that we will discontinue the infusion.    Hypernatremia Improved with D5 infusion.  History of glaucoma Continue home medications.  Timolol eyedrops was held during previous hospitalization due to significant bradycardia.  Holding again.  Hyperbilirubinemia This is mild.  Most likely due to acute illness.  Continue to monitor periodically.  Lactic Acidosis Elevated lactic acid level noted at admission at 3.5.  Most likely due to hypovolemia.  Improved with IV hydration.  Goals of care Palliative medicine is following.  Ongoing discussions with patient's wife.  Patient has worsened in the last 12 hours.  He is not responding.  He is on a nonrebreather.  Family was made aware of poor prognosis yesterday.  We will facilitate family visit today.  Will transition to comfort measures after they have had a chance to see him.   DVT Prophylaxis: Transition to comfort care this afternoon. Code Status: DNR Family Communication: Wife being updated on a daily basis. Disposition Plan: Could have an in-hospital death.  If he stabilizes then we  will consider residential hospice or discharge to home with hospice depending on family preference.   Medications:  Scheduled: . brimonidine  1 drop Both Eyes TID  . brinzolamide  1 drop Both Eyes TID  . Chlorhexidine Gluconate Cloth  6 each Topical Daily  . Chlorhexidine Gluconate Cloth  6 each Topical Daily  . Ipratropium-Albuterol  1 puff Inhalation Q6H  . latanoprost  1 drop Both Eyes QHS  . metoprolol tartrate  2.5 mg Intravenous Q6H   Continuous: . dextrose 30 mL/hr at 04/20/19 1417   YJE:HUDJSHFWYOVZC **OR** acetaminophen, bisacodyl, morphine injection **OR** morphine injection, OLANZapine, ondansetron **OR** ondansetron (ZOFRAN) IV, polyethylene glycol, polyvinyl alcohol, Resource ThickenUp Clear, sodium chloride flush   Objective:  Vital Signs  Vitals:   04/21/19 0000 04/21/19 0200 04/21/19 0400 04/21/19 0600  BP: 128/82 (!) 149/104 128/77 (!) 147/118  Pulse: (!) 49 69 (!) 40 96  Resp: (!) 25 (!) 28 17 17   Temp:      TempSrc:      SpO2: 97% 100% 97% 100%  Weight:      Height:        Intake/Output Summary (Last 24 hours) at 04/21/2019 1148 Last data filed at 04/20/2019 1800 Gross per 24 hour  Intake 645 ml  Output --  Net 645 ml   Filed Weights   04/18/19 0500 04/18/19 0843 04/19/19 0500  Weight: 58.6 kg 58.6 kg 61.4 kg    General appearance: Remains encephalopathic.  Not responding today. Resp: Sounds very congested.  Crackles bilaterally.  Tachypneic. Cardio: S1-S2 is irregular.  Multiple premature beats. GI: Abdomen is soft.  Nontender nondistended.  Bowel sounds are present normal.  No masses organomegaly Extremities: No edema.   Neurologic: Unresponsive this morning    Lab Results:  Data Reviewed: I have personally reviewed following labs and imaging studies  CBC: Recent Labs  Lab 05-09-2019 1039 04/17/19 0440 04/18/19 0030 04/19/19 0556 04/20/19 0500 04/21/19 0536  WBC 13.8* 16.3* 9.2 11.6* 11.4* 12.1*  NEUTROABS 11.9* 13.7*  --   --    --   --   HGB 17.2* 15.6 12.8* 12.9* 13.9 14.3  HCT 55.9* 50.8 39.8 41.3 43.5 43.3  MCV 93.8 93.9 92.1 93.9 90.8 89.1  PLT 270 230 193 160 144* 82*    Basic Metabolic Panel: Recent Labs  Lab 05-09-2019 1039 04/17/19 0440 04/18/19 0030 04/19/19 0556 04/20/19 0500 04/20/19 2335  NA 153* 155* 153* 145 142 133*  K 4.3 4.2 4.2 4.5 4.2 3.9  CL 117* 121* 124* 115* 111 104  CO2 23 21* 22 24 23  21*  GLUCOSE 132* 125* 211* 104* 97 427*  BUN 63* 66* 61* 50* 38* 33*  CREATININE 1.56* 1.60* 1.47* 1.16 1.11 1.22  CALCIUM 10.2 10.1 9.5 9.3 9.4 9.0  MG 3.2* 2.8*  --   --   --  2.1  PHOS 3.7 3.8  --   --   --   --     GFR: Estimated Creatinine Clearance: 37 mL/min (by C-G formula based on SCr of 1.22 mg/dL).  Liver Function Tests: Recent Labs  Lab 05-09-2019 1039 04/17/19  1610 04/18/19 0030 04/19/19 0556 04/20/19 0500  AST 34 37 29 32 38  ALT 52* 46* 40 40 50*  ALKPHOS 80 77 59 68 90  BILITOT 1.4* 1.6* 1.7* 1.8* 2.0*  PROT 6.8 6.3* 5.2* 5.0* 5.2*  ALBUMIN 3.4* 3.3* 2.8* 2.7* 2.7*    CBG: Recent Labs  Lab 04/20/19 1144 04/20/19 1639 04/20/19 2131 04/21/19 0101 04/21/19 0800  GLUCAP 48* 85 90 103* 124*     Recent Results (from the past 240 hour(s))  Blood Culture (routine x 2)     Status: None   Collection Time: 03/27/2019 10:44 AM   Specimen: BLOOD  Result Value Ref Range Status   Specimen Description   Final    BLOOD RIGHT ANTECUBITAL Performed at [facility], [address], [place], [place] [address]    Special Requests   Final    BOTTLES DRAWN AEROBIC AND ANAEROBIC Blood Culture adequate volume Performed at [facility], [address], [place], [place] [address]    Culture   Final    NO GROWTH 5 DAYS Performed at [facility], [address], [place], [place] [address]    Report Status 04/21/2019 FINAL  Final  Blood Culture (routine x 2)     Status: None   Collection Time: 04/02/2019 11:12 AM   Specimen: BLOOD  Result Value  Ref Range Status   Specimen Description   Final    BLOOD LEFT ANTECUBITAL Performed at [facility], [address], [place], [place] [address]    Special Requests   Final    BOTTLES DRAWN AEROBIC AND ANAEROBIC Blood Culture adequate volume Performed at [facility], [address], [place], [place] [address]    Culture   Final    NO GROWTH 5 DAYS Performed at [facility], [address], [place], [place] [address]    Report Status 04/21/2019 FINAL  Final      Radiology Studies: No results found.     LOS: 5 days   Zellie Jenning [employer] on [email or web address]  04/21/2019, 11:48 AM

## 2019-04-21 NOTE — Progress Notes (Signed)
Restraints discontinued per guidelines of comfort care measures, and per patient demeanor. Currently patient is sleeping and is not pulling at lines, and therefore meets the criteria for the discontinuation of restraints.   Will continue to monitor closely.

## 2019-04-21 NOTE — Progress Notes (Signed)
Talked with family (spouse and daughter) about visiting patient.  Wife said she will call me back when she has an exact time.

## 2019-04-22 LAB — GLUCOSE, CAPILLARY: Glucose-Capillary: 91 mg/dL (ref 70–99)

## 2019-04-22 MED ORDER — MORPHINE SULFATE (PF) 2 MG/ML IV SOLN
4.0000 mg | INTRAVENOUS | Status: DC | PRN
  Administered 2019-04-22 (×2): 4 mg via INTRAVENOUS
  Filled 2019-04-22 (×2): qty 2

## 2019-04-22 NOTE — Progress Notes (Signed)
PROGRESS NOTE  Jamil Armwood [id number] DOB: 21-Sep-1929 DOA: 03/27/2019  PCP: [facility]  Brief History/Interval Summary: 85 y.o. male with medical history significant for dementia and history of myocardial infarction who was recently admitted to [facility] for treatment of COVID-19 pneumonia and was discharged on 12/21 but subsequently presented with worsening hypoxia and shortness of breath as well as new onset atrial fibrillation RVR.    Patient was noted to be delirious at presentation.  He was hospitalized for further management.    Reason for Visit: Acute respiratory failure with hypoxia.  Pneumonia due to COVID-19.  Atrial fibrillation with RVR.  Consultants: Palliative medicine  Procedures: PICC line placement 12/23  Antibiotics: Anti-infectives (From admission, onward)   None      Subjective/Interval History: Patient noted to be mildly agitated at times.  Not responsive at all.   Assessment/Plan:  Acute Hypoxic Resp. Failure/Pneumonia due to COVID-19/aspiration pneumonia Patient has had a long course.  From a COVID-19 standpoint he had finished treatment with remdesivir steroids.  He also received Actemra and convalescent plasma. He was mainly rehospitalized for altered mental status poor oral intake.  He subsequently aspirated.  Mentation worsened.  Now he has been transitioned to comfort care.     Acute metabolic encephalopathy in the setting of dementia/delirium Patient now nonresponsive.  Encephalopathy most likely due to COVID-19 in the setting of dementia.  Poor oral intake was also noted.  Prognosis is poor.    Atrial fibrillation with RVR This is a new diagnosis for him.  Patient was initially placed on diltiazem infusion.  He was started on low-dose of metoprolol.  Heart rate has been controlled.  He has been going in and out of atrial fibrillation.  He started having PVCs.  Prognosis is poor.  Taken off of telemetry.  Abnormal  thyroid function tests TSH 0.372.  Free T4 1.57.  Free T4 was 1.87 on 12/13.  Considering poor prognosis no further work-up at this time.    Acute kidney injury on chronic kidney disease stage III Acute renal injury was most likely due to dehydration.  Gently hydrated.  Creatinine has improved.  Hypoglycemia This was due to poor oral intake.    Hypernatremia Improved with D5 infusion.  History of glaucoma Home medications were continued except for timolol which was held due to bradycardia.    Hyperbilirubinemia Most likely due to acute illness.   Lactic Acidosis Elevated lactic acid level noted at admission at 3.5.  Most likely due to hypovolemia.  Improved with IV hydration.  Goals of care Comfort care in progress.  Family has had a chance to visit with patient yesterday.  Depending on how the patient does over the next 24-48 hours we will plan for disposition either to residential hospice or home with hospice.    DVT Prophylaxis: Comfort care Code Status: DNR/comfort care Family Communication: Wife being updated on a daily basis. Disposition Plan: Anticipate in-hospital death.  If he stabilizes then could consider residential hospice or discharge to home with hospice depending on family preference.     Medications:  Scheduled: . brimonidine  1 drop Both Eyes TID  . brinzolamide  1 drop Both Eyes TID  . Chlorhexidine Gluconate Cloth  6 each Topical Daily  . Chlorhexidine Gluconate Cloth  6 each Topical Daily  . latanoprost  1 drop Both Eyes QHS   Continuous:  JQB:HALPFXTKWIOXB **OR** acetaminophen, antiseptic oral rinse, bisacodyl, glycopyrrolate **OR** glycopyrrolate **OR** glycopyrrolate, haloperidol **OR** haloperidol **OR** haloperidol  lactate, LORazepam **OR** LORazepam **OR** LORazepam, morphine injection, ondansetron **OR** ondansetron (ZOFRAN) IV, polyvinyl alcohol, Resource ThickenUp Clear, sodium chloride flush   Objective:  Vital Signs  Vitals:   04/06/2019  0700 04/18/2019 0715 03/31/2019 0730 04/05/2019 0830  BP:      Pulse:   (!) 113 (!) 122  Resp: (!) 25 (!) 24 (!) 23 (!) 24  Temp:    97.6 F (36.4 C)  TempSrc:    Axillary  SpO2:   90%   Weight:      Height:        Intake/Output Summary (Last 24 hours) at 03/26/2019 1052 Last data filed at 04/21/2019 1900 Gross per 24 hour  Intake 0 ml  Output --  Net 0 ml   Filed Weights   04/18/19 0500 04/18/19 0843 04/19/19 0500  Weight: 58.6 kg 58.6 kg 61.4 kg    General appearance: Nonresponsive Resp: Congested.  Tachypneic.  Crackles bilaterally. Cardio: S1-S2 is irregularly irregular GI: Abdomen is soft.  Nontender nondistended.  Bowel sounds are present normal.  No masses organomegaly Extremities: No edema.   Neurologic: Unresponsive     Lab Results:  Data Reviewed: I have personally reviewed following labs and imaging studies  CBC: Recent Labs  Lab 04/15/2019 1039 04/17/19 0440 04/18/19 0030 04/19/19 0556 04/20/19 0500 04/21/19 0536  WBC 13.8* 16.3* 9.2 11.6* 11.4* 12.1*  NEUTROABS 11.9* 13.7*  --   --   --   --   HGB 17.2* 15.6 12.8* 12.9* 13.9 14.3  HCT 55.9* 50.8 39.8 41.3 43.5 43.3  MCV 93.8 93.9 92.1 93.9 90.8 89.1  PLT 270 230 193 160 144* 82*    Basic Metabolic Panel: Recent Labs  Lab 04/10/2019 1039 04/17/19 0440 04/18/19 0030 04/19/19 0556 04/20/19 0500 04/20/19 2335  NA 153* 155* 153* 145 142 133*  K 4.3 4.2 4.2 4.5 4.2 3.9  CL 117* 121* 124* 115* 111 104  CO2 23 21* 22 24 23  21*  GLUCOSE 132* 125* 211* 104* 97 427*  BUN 63* 66* 61* 50* 38* 33*  CREATININE 1.56* 1.60* 1.47* 1.16 1.11 1.22  CALCIUM 10.2 10.1 9.5 9.3 9.4 9.0  MG 3.2* 2.8*  --   --   --  2.1  PHOS 3.7 3.8  --   --   --   --     GFR: Estimated Creatinine Clearance: 37 mL/min (by C-G formula based on SCr of 1.22 mg/dL).  Liver Function Tests: Recent Labs  Lab 04/15/2019 1039 04/17/19 0440 04/18/19 0030 04/19/19 0556 04/20/19 0500  AST 34 37 29 32 38  ALT 52* 46* 40 40 50*   ALKPHOS 80 77 59 68 90  BILITOT 1.4* 1.6* 1.7* 1.8* 2.0*  PROT 6.8 6.3* 5.2* 5.0* 5.2*  ALBUMIN 3.4* 3.3* 2.8* 2.7* 2.7*    CBG: Recent Labs  Lab 04/20/19 2131 04/21/19 0101 04/21/19 0800 04/21/19 1235 04/21/19 1638  GLUCAP 90 103* 124* 105* 92     Recent Results (from the past 240 hour(s))  Blood Culture (routine x 2)     Status: None   Collection Time: 04/09/2019 10:44 AM   Specimen: BLOOD  Result Value Ref Range Status   Specimen Description   Final    BLOOD RIGHT ANTECUBITAL Performed at [facility], [address] [place], [place]    Special Requests   Final    BOTTLES DRAWN AEROBIC AND ANAEROBIC Blood Culture adequate volume Performed at [facility], [address] [place],  [place] [address]    Culture   Final    NO GROWTH 5 DAYS Performed at [facility], [address] [place], [place] [address]    Report Status 04/21/2019 FINAL  Final  Blood Culture (routine x 2)     Status: None   Collection Time: 04/07/2019 11:12 AM   Specimen: BLOOD  Result Value Ref Range Status   Specimen Description   Final    BLOOD LEFT ANTECUBITAL Performed at [facility], [address] [place], [place] [address]    Special Requests   Final    BOTTLES DRAWN AEROBIC AND ANAEROBIC Blood Culture adequate volume Performed at [facility], [address] [place], [place] [address]    Culture   Final    NO GROWTH 5 DAYS Performed at [facility], [address] [place], [place] [address]    Report Status 04/21/2019 FINAL  Final      Radiology Studies: No results found.     LOS: 6 days   Savana Spina [employer] LockerKrishnan  [facility] Pager on [email or web address]  04/12/2019, 10:52 AM

## 2019-04-22 NOTE — Progress Notes (Signed)
Report called to [place], RN, at this time.

## 2019-04-22 NOTE — Progress Notes (Signed)
  Speech Language Pathology Patient Details Name: Cody Bradshaw MRN: [id number] DOB: 1929/10/21 Today's Date: 04/02/2019 Time:  -      Pt now on comfort measures. ST will sign off.                Houston Siren 04/17/2019, 5:45 PM

## 2019-04-22 NOTE — Progress Notes (Signed)
Called pt's wife, Mrs. Luisa Dago at this time, to inform her of the patient being transferred to room 314 at [facility].

## 2019-04-22 NOTE — Progress Notes (Signed)
Received pt from 1C, pt condition unchanged. Vitals recorded. Pt appears to be comfortable at this time. Will continue to monitor.

## 2019-04-22 NOTE — TOC Progression Note (Signed)
Transition of Care [facility]) - Progression Note    Patient Details  Name: Cody Bradshaw MRN: [id number] Date of Birth: May 02, 1929  [facility] CM/SW Contact  Shade Flood, LCSW Phone Number: 04/13/2019, 11:57 AM  Clinical Narrative:     TOC following. Reviewed pt's record today. Per MD, pt transitioned to comfort care. Plan is to monitor over the next 24-48 hours to determine whether pt will remain hospitalized for end of life care or if he is stable for transition to a Hospice residential setting vs Home with hospice.   TOC will follow.  Expected Discharge Plan: (To be determined) Barriers to Discharge: Continued Medical Work up  Expected Discharge Plan and Services Expected Discharge Plan: (To be determined) In-house Referral: Clinical Social Work     Living arrangements for the past 2 months: [place], Single Family Home                                       Social Determinants of Health (SDOH) Interventions    Readmission Risk Interventions No flowsheet data found.

## 2019-04-22 NOTE — Progress Notes (Signed)
RN reassessed patient and changed bed linens due to incont. Patients is oxygenating well but appears anxious/ restless. NRB still in place oxygenating with ease. Will continue to monitor patient.

## 2019-04-22 NOTE — Progress Notes (Signed)
Patient resting in bed, RN adjusted blankets for comfort. Patient refuses to wear NRB. Rn will monitor.

## 2019-04-26 NOTE — Death Summary Note (Signed)
Death Summary  Cody Bradshaw [id number] DOB: 1930-01-23 DOA: 04/10/2019  PCP: [facility]  Admit date: 04/21/2019 Date of Death: 18-Jul-2018 Time of Death: 20:54 Notification: [facility] notified of death of 18-Jul-2018   History of present illness:  Cody Bradshaw is a 86 y.o.malewith medical history significantfor dementia and history of myocardial infarction who was recently [facility] of COVID-19 pneumonia and was discharged on 12/21 but subsequently presented with worsening hypoxia and shortness of breath as well as new onset atrial fibrillation RVR.   Patient was noted to be delirious at presentation.  He was hospitalized for further management.    Cody Bradshaw did not improve after finishing treatment with remdesivir, steroids, Actemra and convalescent plasma.  He subsequently aspirated.  Mentation worsened and he was transitioned to comfort care.     Final Diagnoses:  Patient Active Problem List   Diagnosis Date Noted  . Acute respiratory failure with hypoxia (HCC) 04/06/2019    Priority: High  . Pneumonia due to COVID-19 virus 04/10/2019    Priority: Medium  . Goals of care, counseling/discussion   . Palliative care encounter   . Failure to thrive in adult 04/20/2019  . Atrial fibrillation with tachycardic ventricular rate (HCC) 04/21/2019  . Bradycardia 04/06/2019  . Hypertension   . GERD (gastroesophageal reflux disease)   . Dementia [facility](HCC)      The results of significant diagnostics from this hospitalization (including imaging, microbiology, ancillary and laboratory) are listed below for reference.    Significant Diagnostic Studies: DG Chest Port 1 View  Result Date: 04/17/2019 CLINICAL DATA:  COVID, shortness of breath EXAM: PORTABLE CHEST 1 VIEW COMPARISON:  03/28/2019 FINDINGS: Cardiomegaly. Diffuse aortic atherosclerosis. Diffuse interstitial and airspace opacities, left slightly greater than right.  No significant change since prior study. IMPRESSION: Diffuse bilateral interstitial and alveolar opacities, left slightly greater than right. No real change. Electronically Signed   By: Charlett NoseKevin  Dover M.D.   On: 04/17/2019 08:44   DG Chest Port 1 View  Result Date: 04/02/2019 CLINICAL DATA:  Shortness of breath.  COVID positive EXAM: PORTABLE CHEST 1 VIEW COMPARISON:  04/15/2019 FINDINGS: Diffuse interstitial opacities and patchy airspace disease in the lower lungs, similar to prior study. Heart is normal size. Diffuse aortic atherosclerosis. No effusions or acute bony abnormality. IMPRESSION: Diffuse interstitial disease with patchy bilateral airspace opacities most pronounced in the bases, similar to prior study. Aortic atherosclerosis Electronically Signed   By: Charlett NoseKevin  Dover M.D.   On: 04/17/2019 10:58   DG Chest Portable 1 View  Result Date: 04/15/2019 CLINICAL DATA:  86 year old male with shortness of breath. Positive COVID-19. EXAM: PORTABLE CHEST 1 VIEW COMPARISON:  Chest radiograph dated 04/07/2019. FINDINGS: Bilateral peripheral and subpleural densities with interval progression since the radiograph of 04/07/2019. There is also progression of airspace density at the left lung base with silhouetting of the left hemidiaphragm and left cardiac border concerning for worsening pneumonia. Clinical correlation is recommended. No large pleural effusion or pneumothorax. There is atherosclerotic calcification of the aorta. Coronary vascular calcification noted. No acute osseous pathology. IMPRESSION: 1. Worsening bilateral airspace densities with predominant involvement of the left lung base. Clinical correlation and follow-up recommended. 2. Atherosclerotic calcification of the aorta. Electronically Signed   By: Elgie CollardArash  Radparvar M.D.   On: 04/15/2019 20:11   DG CHEST PORT 1 VIEW  Result Date: 04/07/2019 CLINICAL DATA:  Pneumonia due to COVID-19 virus. EXAM: PORTABLE CHEST 1 VIEW COMPARISON:  April 05, 2019.  FINDINGS: Stable cardiomediastinal silhouette. Stable bibasilar opacities are noted concerning for pneumonia. Atherosclerosis of thoracic aorta is noted. No pneumothorax or pleural effusion is noted. Bony thorax is unremarkable. IMPRESSION: Aortic atherosclerosis. Stable bibasilar opacities are noted concerning for pneumonia. Follow-up radiographs are recommended to ensure resolution. Electronically Signed   By: Marijo Conception M.D.   On: 04/07/2019 08:58   [place] EKG SITE RITE  Result Date: 04/17/2019 If Site Rite image not attached, placement could not be confirmed due to current cardiac rhythm.   Microbiology: Recent Results (from the past 240 hour(s))  Blood Culture (routine x 2)     Status: None   Collection Time: 04/18/2019 10:44 AM   Specimen: BLOOD  Result Value Ref Range Status   Specimen Description   Final    BLOOD RIGHT ANTECUBITAL Performed at [facility], [place] [address], [place], [place] [address]    Special Requests   Final    BOTTLES DRAWN AEROBIC AND ANAEROBIC Blood Culture adequate volume Performed at [place] [address], [place], [place] [address]    Culture   Final    NO GROWTH 5 DAYS Performed at [facility], [place] [address] [place], [place] [address]    Report Status 04/21/2019 FINAL  Final  Blood Culture (routine x 2)     Status: None   Collection Time: 04/19/2019 11:12 AM   Specimen: BLOOD  Result Value Ref Range Status   Specimen Description   Final    BLOOD LEFT ANTECUBITAL Performed at [facility], [place] [address] [place], [place] [address]    Special Requests   Final    BOTTLES DRAWN AEROBIC AND ANAEROBIC Blood Culture adequate volume Performed at [place] [address] [place], [place] [address]    Culture   Final    NO GROWTH 5 DAYS Performed at [facility], [place] [address] [place], [place] [address]    Report Status 04/21/2019 FINAL  Final     Labs: Basic  Metabolic Panel: Recent Labs  Lab 04/10/2019 1039 04/17/19 0440 04/18/19 0030 04/19/19 0556 04/20/19 0500 04/20/19 2335  NA 153* 155* 153* 145 142 133*  K 4.3 4.2 4.2 4.5 4.2 3.9  CL 117* 121* 124* 115* 111 104  CO2 23 21* 22 24 23  21*  GLUCOSE 132* 125* 211* 104* 97 427*  BUN 63* 66* 61* 50* 38* 33*  CREATININE 1.56* 1.60* 1.47* 1.16 1.11 1.22  CALCIUM 10.2 10.1 9.5 9.3 9.4 9.0  MG 3.2* 2.8*  --   --   --  2.1  PHOS 3.7 3.8  --   --   --   --    Liver Function Tests: Recent Labs  Lab 04/04/2019 1039 04/17/19 0440 04/18/19 0030 04/19/19 0556 04/20/19 0500  AST 34 37 29 32 38  ALT 52* 46* 40 40 50*  ALKPHOS 80 77 59 68 90  BILITOT 1.4* 1.6* 1.7* 1.8* 2.0*  PROT 6.8 6.3* 5.2* 5.0* 5.2*  ALBUMIN 3.4* 3.3* 2.8* 2.7* 2.7*   No results for input(s): LIPASE, AMYLASE in the last 168 hours. No results for input(s): AMMONIA in the last 168 hours. CBC: Recent Labs  Lab 04/06/2019 1039 04/17/19 0440 04/18/19 0030 04/19/19 0556 04/20/19 0500 04/21/19 0536  WBC 13.8* 16.3* 9.2 11.6* 11.4* 12.1*  NEUTROABS 11.9* 13.7*  --   --   --   --   HGB 17.2* 15.6 12.8* 12.9* 13.9 14.3  HCT  55.9* 50.8 39.8 41.3 43.5 43.3  MCV 93.8 93.9 92.1 93.9 90.8 89.1  PLT 270 230 193 160 144* 82*   Cardiac Enzymes: No results for input(s): CKTOTAL, CKMB, CKMBINDEX, TROPONINI in the last 168 hours. D-Dimer No results for input(s): DDIMER in the last 72 hours. BNP: Invalid input(s): POCBNP CBG: Recent Labs  Lab 04/20/19 2131 04/21/19 0101 04/21/19 0800 04/21/19 1235 04/21/19 1638  GLUCAP 90 103* 124* 105* 92   Anemia work up No results for input(s): VITAMINB12, FOLATE, FERRITIN, TIBC, IRON, RETICCTPCT in the last 72 hours. Urinalysis No results found for: COLORURINE, APPEARANCEUR, LABSPEC, PHURINE, GLUCOSEU, HGBUR, BILIRUBINUR, KETONESUR, PROTEINUR, UROBILINOGEN, NITRITE, LEUKOCYTESUR Sepsis Labs Invalid input(s): PROCALCITONIN,  WBC,  LACTICIDVEN     SIGNED:  Coletta Memos,  MD  [facility] 04/12/2019, 9:54 PM Pager   If 7PM-7AM, please contact night-coverage [email or web address] Password [email or web address]

## 2019-04-26 DEATH — deceased

## 2020-08-23 IMAGING — DX DG CHEST 1V PORT
1 series · 1 of 1 positions shown · non-contrast
Comparison: 04/15/2019

CLINICAL DATA: Shortness of breath.  COVID positive

EXAM:
PORTABLE CHEST 1 VIEW

[chest ap]
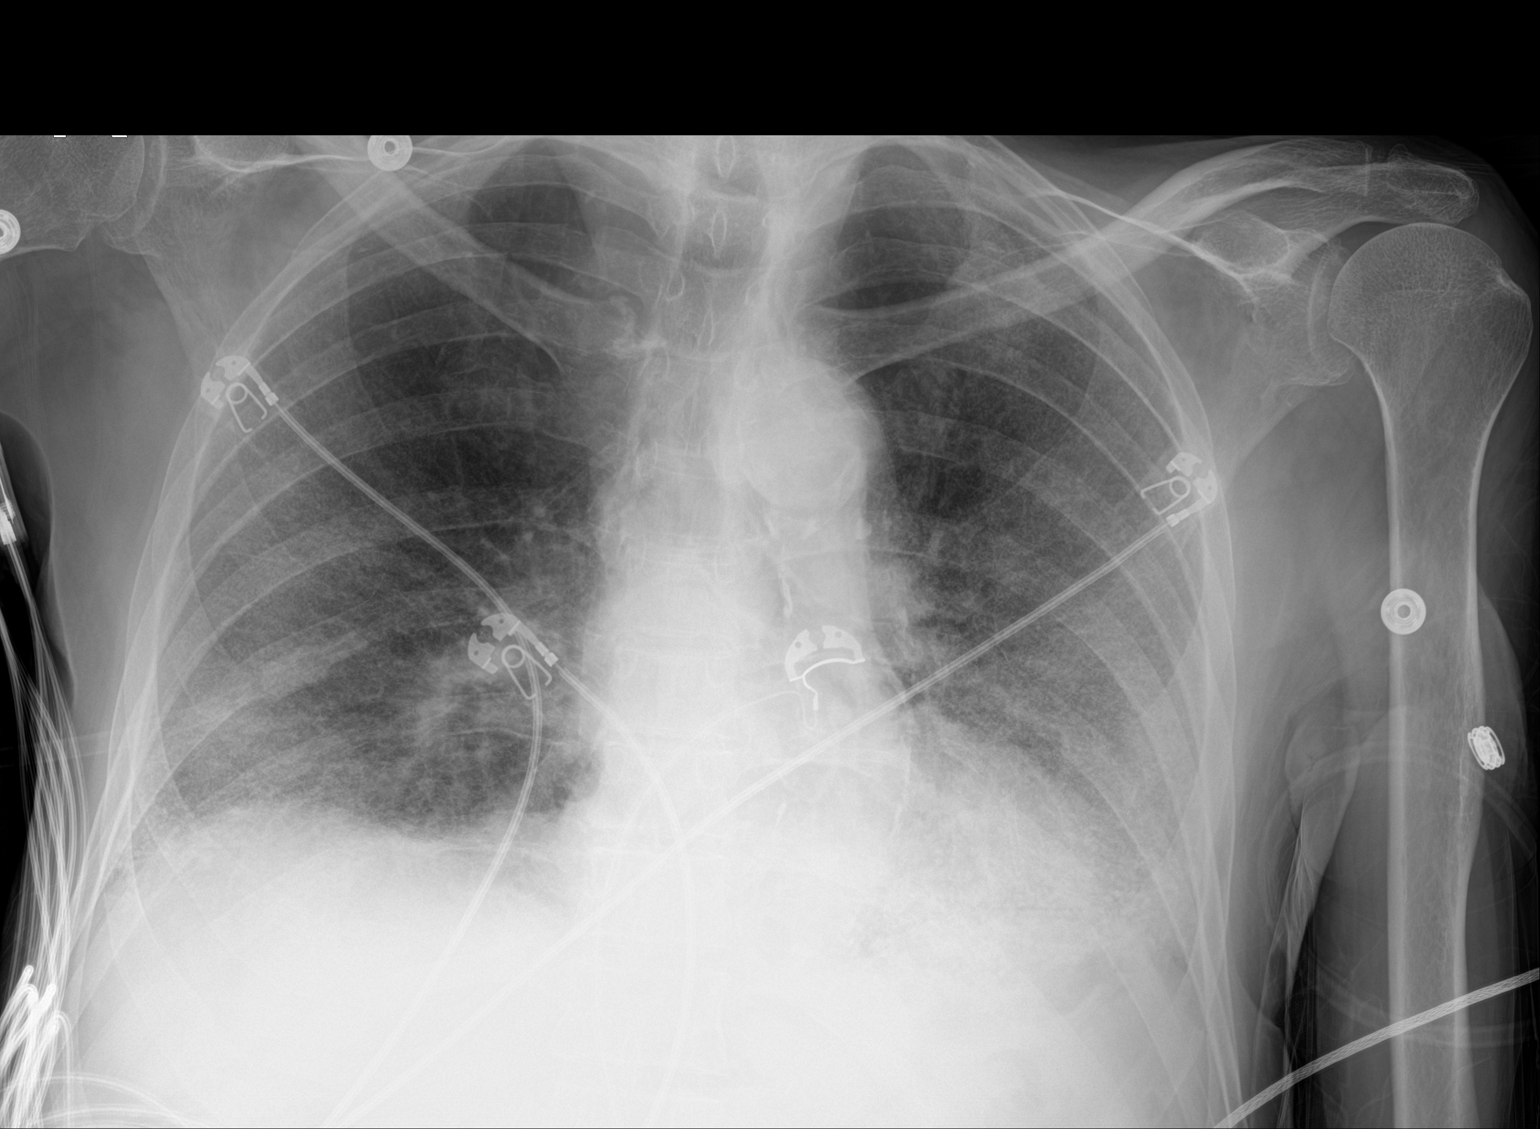

[1 of 1 positions shown; findings below may reference images not displayed]

FINDINGS: Diffuse interstitial opacities and patchy airspace disease in the
lower lungs, similar to prior study. Heart is normal size. Diffuse
aortic atherosclerosis. No effusions or acute bony abnormality.
IMPRESSION: Diffuse interstitial disease with patchy bilateral airspace
opacities most pronounced in the bases, similar to prior study.

Aortic atherosclerosis

## 2020-08-24 IMAGING — DX DG CHEST 1V PORT
1 series · 1 of 1 positions shown · non-contrast
Comparison: 04/16/2019

CLINICAL DATA: COVID, shortness of breath

EXAM:
PORTABLE CHEST 1 VIEW

[chest]
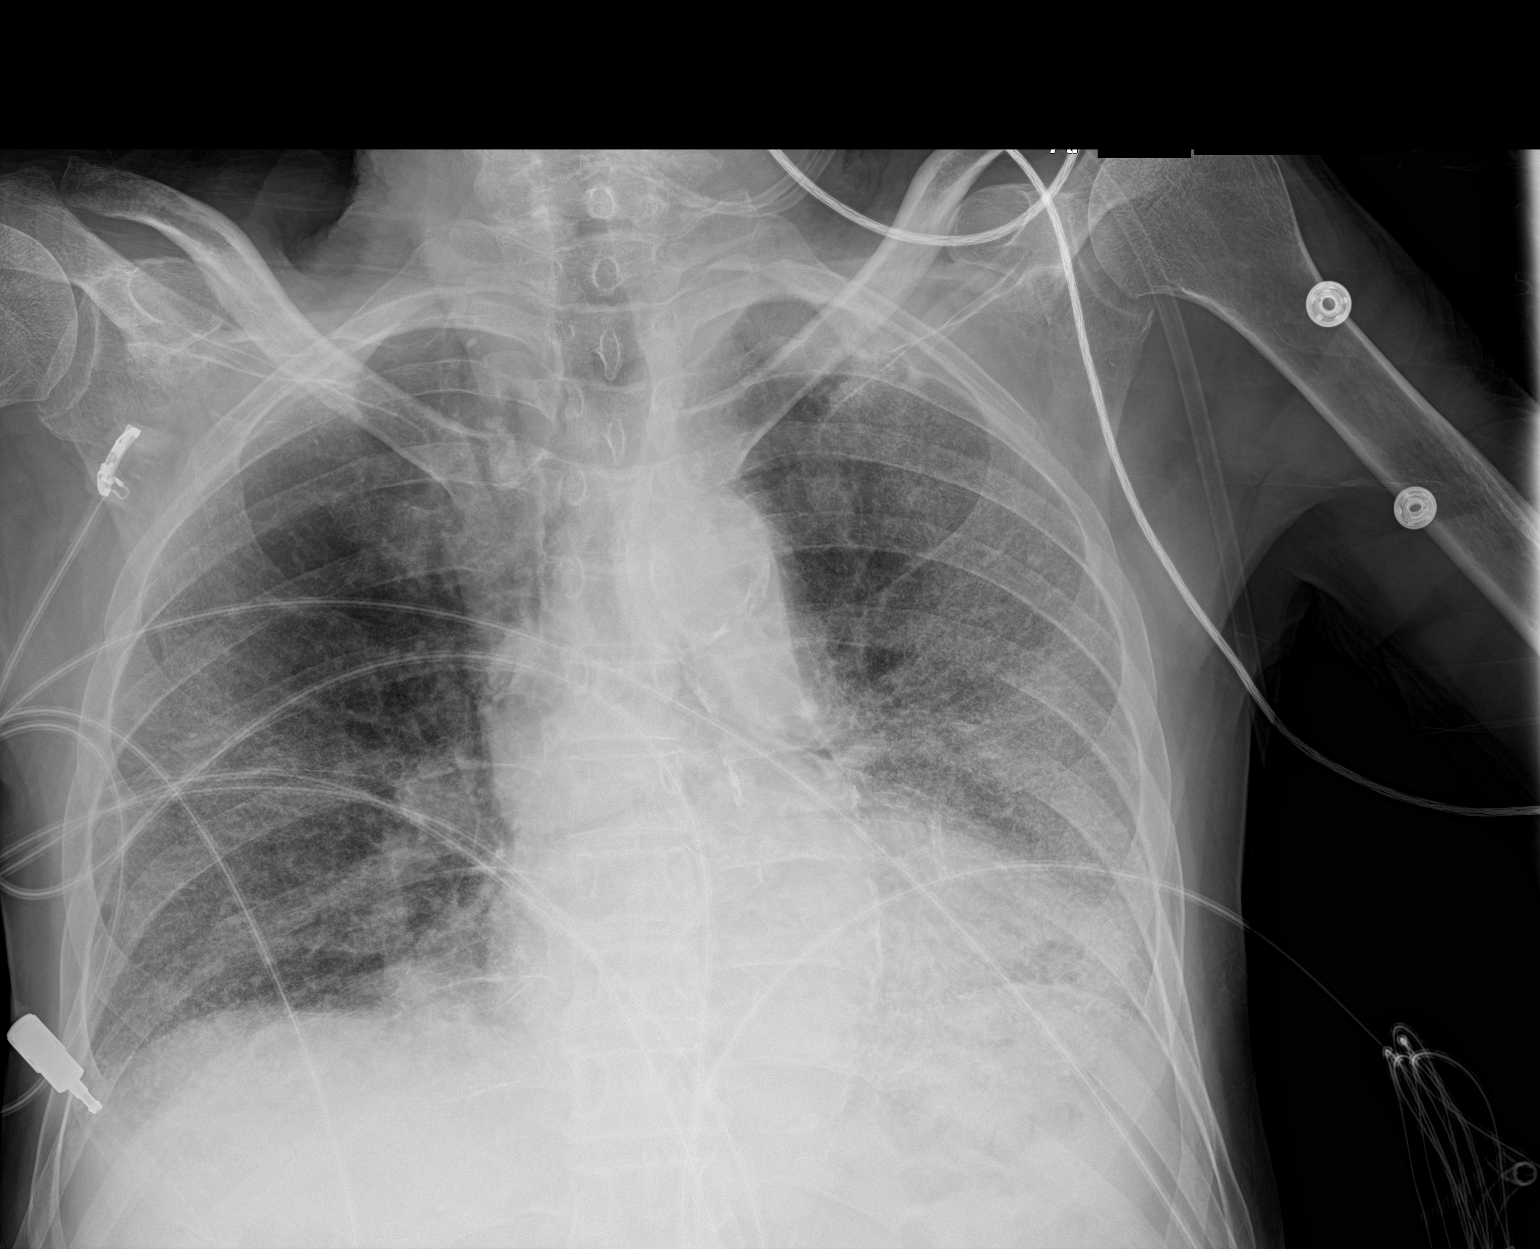

[1 of 1 positions shown; findings below may reference images not displayed]

FINDINGS: Cardiomegaly. Diffuse aortic atherosclerosis. Diffuse interstitial
and airspace opacities, left slightly greater than right. No
significant change since prior study.
IMPRESSION: Diffuse bilateral interstitial and alveolar opacities, left slightly
greater than right. No real change.
